# Patient Record
Sex: Female | Born: 1985 | Race: Black or African American | Hispanic: No | Marital: Single | Smoking: Never smoker
Health system: Southern US, Community
[De-identification: ages and names within clinical notes are randomized; demographics above are authoritative.]

## PROBLEM LIST (undated history)

## (undated) DIAGNOSIS — T7840XA Allergy, unspecified, initial encounter: Secondary | ICD-10-CM

## (undated) HISTORY — DX: Allergy, unspecified, initial encounter: T78.40XA

## (undated) HISTORY — PX: NO PAST SURGERIES: SHX2092

---
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
## 2015-10-11 ENCOUNTER — Ambulatory Visit (INDEPENDENT_AMBULATORY_CARE_PROVIDER_SITE_OTHER): Payer: Self-pay | Admitting: Family Medicine

## 2015-10-11 VITALS — BP 114/70 | HR 71 | Temp 98.5°F | Resp 16 | Ht 66.0 in | Wt 123.0 lb

## 2015-10-11 DIAGNOSIS — L858 Other specified epidermal thickening: Secondary | ICD-10-CM

## 2015-10-11 DIAGNOSIS — Q829 Congenital malformation of skin, unspecified: Secondary | ICD-10-CM

## 2015-10-11 DIAGNOSIS — R1013 Epigastric pain: Secondary | ICD-10-CM

## 2015-10-11 MED ORDER — TRIAMCINOLONE ACETONIDE 0.1 % EX CREA: 1.0000 "application " | TOPICAL_CREAM | Freq: Two times a day (BID) | CUTANEOUS | Status: DC

## 2015-10-11 MED ORDER — RANITIDINE HCL 150 MG PO TABS: 150.0000 mg | ORAL_TABLET | Freq: Two times a day (BID) | ORAL | Status: DC

## 2015-10-11 NOTE — Progress Notes (Signed)
   Subjective:    Patient ID: Sandra Nicholson, female    DOB: 02/10/1986, 30 y.o.   MRN: [id number] By signing my name below, I, Sandra Nicholson, attest that this documentation has been prepared under the direction and in the presence of Elvina SidleKurt Tarin Johndrow, MD.  Electronically Signed: Littie Deedsichard Nicholson, Medical Scribe. 10/11/2015. 10:33 AM.  HPI HPI Comments: Sandra Nicholson is a 30 y.o. female with a history of exercise-induced asthma who presents to the [facility] complaining of an episode of sharp abdominal pain last night. She does not currently have any abdominal pain. Patient has also felt muscle spasms/vesiculations in her legs. She also reports having swelling to her eyes, but she does not think this is due to seasonal allergies. She reports having a rash to her left hand, right arm, and upper chest. She often feels gassy which is sometimes improved after taking a laxative. Patient denies diarrhea, fever, chills, and diaphoresis. She states she had an episode of abdominal pain after eating a few weeks ago, which was resolved after taking a medication.  Patient is currently unemployed.  Review of Systems  Constitutional: Negative for fever, chills and diaphoresis.  Gastrointestinal: Positive for abdominal pain. Negative for diarrhea.  Musculoskeletal: Positive for myalgias.  Skin: Positive for rash.  Allergic/Immunologic: Positive for environmental allergies.       Objective:   Physical Exam CONSTITUTIONAL: Well developed/well nourished HEAD: Normocephalic/atraumatic EYES: EOM/PERRL. Mild hyperpigmentation of her lower lids with mild edema. No scleral injection.  ENMT: Mucous membranes moist. Ears normal. Ears normal. NECK: supple no meningeal signs SPINE: entire spine nontender CV: Regular rate and rhythm. S1/S2 noted, no murmurs/rubs/gallops noted LUNGS: Lungs are clear to auscultation bilaterally, no apparent distress ABDOMEN: soft, nontender, no rebound or guarding GU:  no cva tenderness NEURO: Pt is awake/alert, moves all extremitiesx4 EXTREMITIES: pulses normal, full ROM SKIN: warm, color normal PSYCH: no abnormalities of mood noted        Assessment & Plan:   This chart was scribed in my presence and reviewed by me personally.    ICD-9-CM ICD-10-CM   1. Abdominal pain, epigastric 789.06 R10.13 ranitidine (ZANTAC) 150 MG tablet  2. Keratosis pilaris 757.39 Q82.9 triamcinolone cream (KENALOG) 0.1 %     Signed, Elvina SidleKurt Yasser Hepp, MD

## 2015-10-11 NOTE — Patient Instructions (Addendum)
Please start Align (probiotic) twice a day for one week.  Stop Reglan.

## 2016-08-09 ENCOUNTER — Ambulatory Visit (HOSPITAL_COMMUNITY)
Admission: EM | Admit: 2016-08-09 | Discharge: 2016-08-09 | Disposition: A | Discharge status: Home / Self Care | Payer: Self-pay | Attending: Family Medicine | Admitting: Family Medicine

## 2016-08-09 ENCOUNTER — Encounter (HOSPITAL_COMMUNITY): Payer: Self-pay | Admitting: Family Medicine

## 2016-08-09 DIAGNOSIS — J028 Acute pharyngitis due to other specified organisms: Secondary | ICD-10-CM

## 2016-08-09 DIAGNOSIS — J029 Acute pharyngitis, unspecified: Secondary | ICD-10-CM | POA: Insufficient documentation

## 2016-08-09 DIAGNOSIS — Z87891 Personal history of nicotine dependence: Secondary | ICD-10-CM | POA: Insufficient documentation

## 2016-08-09 DIAGNOSIS — Z833 Family history of diabetes mellitus: Secondary | ICD-10-CM | POA: Insufficient documentation

## 2016-08-09 LAB — POCT RAPID STREP A: STREPTOCOCCUS, GROUP A SCREEN (DIRECT): NEGATIVE

## 2016-08-09 MED ORDER — LIDOCAINE VISCOUS 2 % MT SOLN: 20.0000 mL | OROMUCOSAL | 0 refills | Status: DC | PRN

## 2016-08-09 MED ORDER — AMOXICILLIN 875 MG PO TABS: 875.0000 mg | ORAL_TABLET | Freq: Two times a day (BID) | ORAL | 0 refills | Status: DC

## 2016-08-09 NOTE — ED Triage Notes (Signed)
Pt here for sore throat and tonsillar swelling.

## 2016-08-09 NOTE — ED Provider Notes (Signed)
CSN: [id number]     Arrival date & time 08/09/16  1433 History   First MD Initiated Contact with Patient 08/09/16 1633     Chief Complaint  Patient presents with  . Sore Throat   (Consider location/radiation/quality/duration/timing/severity/associated sxs/prior Treatment) Patient c/o sore throat for 2 days   The history is provided by the patient.  Sore Throat  This is a new problem. The problem occurs constantly. The problem has not changed since onset.Nothing aggravates the symptoms. Nothing relieves the symptoms. She has tried nothing for the symptoms.    Past Medical History:  Diagnosis Date  . Allergy    History reviewed. No pertinent surgical history. Family History  Problem Relation Age of Onset  . Diabetes Father    Social History  Substance Use Topics  . Smoking status: Former Games developermoker  . Smokeless tobacco: Never Used  . Alcohol use Not on file   OB History    No data available     Review of Systems  Constitutional: Positive for fatigue.  HENT: Positive for sore throat.   Respiratory: Negative.   Cardiovascular: Negative.   Gastrointestinal: Negative.   Endocrine: Negative.   Genitourinary: Negative.   Musculoskeletal: Negative.   Allergic/Immunologic: Negative.   Neurological: Negative.   Hematological: Negative.     Allergies  Patient has no known allergies.  Home Medications   Prior to Admission medications   Medication Sig Start Date End Date Taking? Authorizing Provider  amoxicillin (AMOXIL) 875 MG tablet Take 1 tablet (875 mg total) by mouth 2 (two) times daily. 08/09/16   Deatra CanterWilliam J Harly Pipkins, FNP  lidocaine (XYLOCAINE) 2 % solution Use as directed 20 mLs in the mouth or throat as needed for mouth pain. 08/09/16   Deatra CanterWilliam J Beth Spackman, FNP  ranitidine (ZANTAC) 150 MG tablet Take 1 tablet (150 mg total) by mouth 2 (two) times daily. 10/11/15   Elvina SidleKurt Lauenstein, MD  triamcinolone cream (KENALOG) 0.1 % Apply 1 application topically 2 (two) times daily.  10/11/15   Elvina SidleKurt Lauenstein, MD   Meds Ordered and Administered this Visit  Medications - No data to display  BP 116/83   Pulse 64   Temp 98.2 F (36.8 C)   Resp 18   SpO2 100%  No data found.   Physical Exam  Constitutional: She appears well-developed and well-nourished.  HENT:  Head: Normocephalic.  Right Ear: External ear normal.  Left Ear: External ear normal.  opx - erythematous and with swelling and petechia.  No cobblestone    Eyes: Conjunctivae and EOM are normal. Pupils are equal, round, and reactive to light.  Cardiovascular: Normal rate, regular rhythm and normal heart sounds.   Pulmonary/Chest: Effort normal and breath sounds normal.  Abdominal: Soft. Bowel sounds are normal.  Lymphadenopathy:    She has cervical adenopathy.  Nursing note and vitals reviewed.   Urgent Care Course     Procedures (including critical care time)  Labs Review Labs Reviewed  POCT RAPID STREP A    Imaging Review No results found.   Visual Acuity Review  Right Eye Distance:   Left Eye Distance:   Bilateral Distance:    Right Eye Near:   Left Eye Near:    Bilateral Near:         MDM   1. Acute pharyngitis due to other specified organisms    Amoxicillin 875mg  one po bid x 7 days #14 Lidocaine Viscous 20ml po qid prn #14000ml  Push po fluids, rest, tylenol and motrin otc  prn as directed for fever, arthralgias, and myalgias.  Follow up prn if sx's continue or persist.    Deatra Canter, FNP 08/09/16 1651

## 2016-08-12 LAB — CULTURE, GROUP A STREP (THRC)

## 2016-12-01 ENCOUNTER — Ambulatory Visit (HOSPITAL_COMMUNITY)
Admission: EM | Admit: 2016-12-01 | Discharge: 2016-12-01 | Disposition: A | Discharge status: Home / Self Care | Payer: Self-pay | Attending: Family Medicine | Admitting: Family Medicine

## 2016-12-01 ENCOUNTER — Encounter (HOSPITAL_COMMUNITY): Payer: Self-pay | Admitting: *Deleted

## 2016-12-01 DIAGNOSIS — R110 Nausea: Secondary | ICD-10-CM

## 2016-12-01 DIAGNOSIS — T783XXA Angioneurotic edema, initial encounter: Secondary | ICD-10-CM

## 2016-12-01 DIAGNOSIS — R109 Unspecified abdominal pain: Secondary | ICD-10-CM

## 2016-12-01 MED ORDER — ONDANSETRON 4 MG PO TBDP: 4.0000 mg | ORAL_TABLET | Freq: Three times a day (TID) | ORAL | 0 refills | Status: DC | PRN

## 2016-12-01 MED ORDER — CHLORHEXIDINE GLUCONATE 0.12 % MT SOLN: 15.0000 mL | Freq: Two times a day (BID) | OROMUCOSAL | 0 refills | Status: DC

## 2016-12-01 MED ORDER — METHYLPREDNISOLONE 4 MG PO TBPK: ORAL_TABLET | ORAL | 0 refills | Status: DC

## 2016-12-01 NOTE — ED Triage Notes (Signed)
Patient reports 2 days ago she was eating and afterwards felt sick to her stomach then felt like she was having an allergic reaction to her lips and mouth. Patient states she still feels a "rumble" in her stomach with lack of appetite. Patient reports small bumps inside of mouth.

## 2016-12-01 NOTE — ED Provider Notes (Signed)
CSN: [id number]     Arrival date & time 12/01/16  1442 History   None    Chief Complaint  Patient presents with  . Abdominal Pain  . Allergic Reaction   (Consider location/radiation/quality/duration/timing/severity/associated sxs/prior Treatment) Patient c/o having allergic reaction to something she ate and her lips have been swelling.  She felt sick to her stomach and has a rumble in her stomach.  She denies fever and    The history is provided by the patient.  Abdominal Pain  Pain location:  Generalized Pain radiates to:  Does not radiate Pain severity:  Mild Onset quality:  Sudden Duration:  1 week Timing:  Constant Chronicity:  New Allergic Reaction    Past Medical History:  Diagnosis Date  . Allergy    History reviewed. No pertinent surgical history. Family History  Problem Relation Age of Onset  . Diabetes Father    Social History  Substance Use Topics  . Smoking status: Former Games developermoker  . Smokeless tobacco: Never Used  . Alcohol use 0.0 oz/week     Comment: occ   OB History    No data available     Review of Systems  Constitutional: Negative.   HENT: Negative.   Eyes: Negative.   Respiratory: Negative.   Cardiovascular: Negative.   Gastrointestinal: Positive for abdominal pain.  Endocrine: Negative.   Genitourinary: Negative.   Allergic/Immunologic: Negative.   Neurological: Negative.     Allergies  Macadamia nut oil  Home Medications   Prior to Admission medications   Medication Sig Start Date End Date Taking? Authorizing Provider  amoxicillin (AMOXIL) 875 MG tablet Take 1 tablet (875 mg total) by mouth 2 (two) times daily. 08/09/16   Deatra Canterxford, Andrey Hoobler J, FNP  chlorhexidine (PERIDEX) 0.12 % solution Use as directed 15 mLs in the mouth or throat 2 (two) times daily. 12/01/16   Deatra Canterxford, Lateesha Bezold J, FNP  lidocaine (XYLOCAINE) 2 % solution Use as directed 20 mLs in the mouth or throat as needed for mouth pain. 08/09/16   Deatra Canterxford, [place] Schweppe J, FNP   methylPREDNISolone (MEDROL DOSEPAK) 4 MG TBPK tablet Take 6-5-4-3-2-1 po qd 12/01/16   Deatra Canterxford, Jamylah Marinaccio J, FNP  methylPREDNISolone (MEDROL DOSEPAK) 4 MG TBPK tablet Take 6-5-4-3-2-1 po qd 12/01/16   Deatra Canterxford, Alexza Norbeck J, FNP  ondansetron (ZOFRAN ODT) 4 MG disintegrating tablet Take 1 tablet (4 mg total) by mouth every 8 (eight) hours as needed for nausea or vomiting. 12/01/16   Deatra Canterxford, Coty Larsh J, FNP  ondansetron (ZOFRAN ODT) 4 MG disintegrating tablet Take 1 tablet (4 mg total) by mouth every 8 (eight) hours as needed for nausea or vomiting. 12/01/16   Deatra Canterxford, Endy Easterly J, FNP  ranitidine (ZANTAC) 150 MG tablet Take 1 tablet (150 mg total) by mouth 2 (two) times daily. 10/11/15   Elvina SidleLauenstein, Kurt, MD  triamcinolone cream (KENALOG) 0.1 % Apply 1 application topically 2 (two) times daily. 10/11/15   Elvina SidleLauenstein, Kurt, MD   Meds Ordered and Administered this Visit  Medications - No data to display  BP 120/80   Pulse 87   Temp 98 F (36.7 C) (Oral)   Resp 17   SpO2 100%  No data found.   Physical Exam  Constitutional: She is oriented to person, place, and time. She appears well-developed and well-nourished.  HENT:  Head: Normocephalic and atraumatic.  Eyes: Conjunctivae and EOM are normal. Pupils are equal, round, and reactive to light.  Neck: Normal range of motion. Neck supple.  Cardiovascular: Normal rate, regular rhythm and normal  heart sounds.   Pulmonary/Chest: Effort normal and breath sounds normal.  Abdominal: Soft. Bowel sounds are normal.  Musculoskeletal: Normal range of motion.  Neurological: She is alert and oriented to person, place, and time.  Nursing note and vitals reviewed.   Urgent Care Course     Procedures (including critical care time)  Labs Review Labs Reviewed - No data to display  Imaging Review No results found.   Visual Acuity Review  Right Eye Distance:   Left Eye Distance:   Bilateral Distance:    Right Eye Near:   Left Eye Near:    Bilateral  Near:         MDM   1. Angioedema, initial encounter   2. Nausea    Medrol dose pack as directed zofran peridex oral solution       Deatra Canter, FNP 12/01/16 1712

## 2017-01-11 ENCOUNTER — Encounter (HOSPITAL_COMMUNITY): Payer: Self-pay | Admitting: Emergency Medicine

## 2017-01-11 ENCOUNTER — Ambulatory Visit (HOSPITAL_COMMUNITY)
Admission: EM | Admit: 2017-01-11 | Discharge: 2017-01-11 | Disposition: A | Discharge status: Home / Self Care | Payer: Self-pay | Attending: Family Medicine | Admitting: Family Medicine

## 2017-01-11 DIAGNOSIS — R110 Nausea: Secondary | ICD-10-CM | POA: Insufficient documentation

## 2017-01-11 DIAGNOSIS — J029 Acute pharyngitis, unspecified: Secondary | ICD-10-CM | POA: Insufficient documentation

## 2017-01-11 DIAGNOSIS — R109 Unspecified abdominal pain: Secondary | ICD-10-CM | POA: Insufficient documentation

## 2017-01-11 DIAGNOSIS — M62838 Other muscle spasm: Secondary | ICD-10-CM

## 2017-01-11 DIAGNOSIS — Z87891 Personal history of nicotine dependence: Secondary | ICD-10-CM | POA: Insufficient documentation

## 2017-01-11 DIAGNOSIS — M62831 Muscle spasm of calf: Secondary | ICD-10-CM | POA: Insufficient documentation

## 2017-01-11 LAB — POCT RAPID STREP A: Streptococcus, Group A Screen (Direct): NEGATIVE

## 2017-01-11 MED ORDER — DICLOFENAC SODIUM 50 MG PO TBEC: 50.0000 mg | DELAYED_RELEASE_TABLET | Freq: Two times a day (BID) | ORAL | 0 refills | Status: DC

## 2017-01-11 MED ORDER — ONDANSETRON 4 MG PO TBDP
4.0000 mg | ORAL_TABLET | Freq: Three times a day (TID) | ORAL | 0 refills | Status: DC | PRN
Start: 2017-01-11 — End: 2017-03-03

## 2017-01-11 MED ORDER — CYCLOBENZAPRINE HCL 5 MG PO TABS: 5.0000 mg | ORAL_TABLET | Freq: Two times a day (BID) | ORAL | 0 refills | Status: DC | PRN

## 2017-01-11 NOTE — ED Provider Notes (Signed)
[place]    CSN: [id number] Arrival date & time: 01/11/17  1259  History   Chief Complaint Chief Complaint  Patient presents with  . Sore Throat  . Abdominal Pain    HPI Sandra Nicholson is a 31 y.o. female.   HPI Sore throat  The patient has been complaining of a sore throat over the past 4 days. It is slightly improved. She's not having upper respiratory signs or symptoms. She denies any fevers. No sick contacts. She's not tried anything at home. She was rx'd viscous lidocaine in the past which she said was helpful and she is requesting more today.  Abdominal complaint The patient has a strange sensation in her abdomen she has difficulty quantifying and describing. She is having some nausea without vomiting. Her bowel movements are unchanged. There was no physical injury to her stomach. No new foods, recent travel, medication changes, bleeding, recent antibiotic use, or sick contacts for this.  Spasms The patient has a history of spasms in her lower extremities. Over the past week, it has been bothering her. She states it is worsening. She would like a muscle relaxant. She maintains that she does have a good oral intake of fluids. There is no pain. Her legs move on their own. She does not have any numbness, tearing, or weakness. No history of seizures.  Past Medical History:  Diagnosis Date  . Allergy     History reviewed. No pertinent surgical history.   Home Medications    Prior to Admission medications   Medication Sig Start Date End Date Taking? Authorizing Provider  cyclobenzaprine (FLEXERIL) 5 MG tablet Take 1 tablet (5 mg total) by mouth 2 (two) times daily as needed for muscle spasms. 01/11/17   Shelda Pal, DO  diclofenac (VOLTAREN) 50 MG EC tablet Take 1 tablet (50 mg total) by mouth 2 (two) times daily. 01/11/17   Shelda Pal, DO  ondansetron (ZOFRAN ODT) 4 MG disintegrating tablet Take 1 tablet (4 mg total) by mouth every 8 (eight)  hours as needed for nausea or vomiting. 01/11/17   Shelda Pal, DO    Family History Family History  Problem Relation Age of Onset  . Diabetes Father     Social History Social History  Substance Use Topics  . Smoking status: Former Research scientist (life sciences)  . Smokeless tobacco: Never Used  . Alcohol use 0.0 oz/week     Comment: occ    Allergies   Macadamia nut oil   Review of Systems Review of Systems  Constitutional: Negative for fever.  Gastrointestinal: Positive for nausea.     Physical Exam Triage Vital Signs ED Triage Vitals  Enc Vitals Group     BP 01/11/17 1353 109/67     Pulse Rate 01/11/17 1353 68     Resp 01/11/17 1353 16     Temp 01/11/17 1353 98.7 F (37.1 C)     Temp Source 01/11/17 1353 Oral     SpO2 01/11/17 1353 100 %     Weight 01/11/17 1350 143 lb (64.9 kg)     Height 01/11/17 1350 '5\' 7"'$  (1.702 m)   Updated Vital Signs BP 109/67   Pulse 68   Temp 98.7 F (37.1 C) (Oral)   Resp 16   Ht '5\' 7"'$  (1.702 m)   Wt 143 lb (64.9 kg)   LMP 12/25/2016   SpO2 100%   BMI 22.40 kg/m   Physical Exam  Constitutional: She is oriented to person, place, and  time. She appears well-developed and well-nourished.  HENT:  Head: Normocephalic and atraumatic.  Right Ear: External ear normal.  Left Ear: External ear normal.  Nose: Nose normal.  Mouth/Throat: Oropharynx is clear and moist. No oropharyngeal exudate.  Eyes: EOM are normal. Pupils are equal, round, and reactive to light.  Neck: Normal range of motion. Neck supple.  Cardiovascular: Normal rate and regular rhythm.   No murmur heard. Pulmonary/Chest: Effort normal and breath sounds normal. No respiratory distress.  Abdominal: Soft. Bowel sounds are normal. She exhibits no distension and no mass. There is no tenderness. There is no guarding.  Musculoskeletal: Normal range of motion. She exhibits no tenderness.  Lymphadenopathy:    She has no cervical adenopathy.  Neurological: She is alert and oriented  to person, place, and time. She displays normal reflexes.  Skin: Skin is warm and dry. She is not diaphoretic.     UC Treatments / Results  Labs (all labs ordered are listed, but only abnormal results are displayed) Labs Reviewed  POCT RAPID STREP A   Procedures Procedures none  Initial Impression / Assessment and Plan / UC Course  I have reviewed the triage vital signs and the nursing notes.  Pertinent labs & imaging results that were available during my care of the patient were reviewed by me and considered in my medical decision making (see chart for details).     31 year old patient presents with a myriad of complaints. She does not have a set primary care physician. She presents with one week of muscle spasms that are worsening. Recommended adequate hydration, trial of OTC magnesium, and muscle relaxant as needed. Emphasized that she needs to see a primary care physician for further management of this issue. Rapid strep is negative. 1/4 Centor criteria (lack of cough). Anti-inflammatory for this. She also brought up dry mouth which I recommended a saliva stimulating mouthwash. She also had very nonspecific and vague abdominal complaints. She does have some nausea without vomiting. There are no red flag signs or suggestion of an acute abdomen on exam. Will treat symptomatically with Zofran. Again, I emphasized on multiple occasions that she needs to schedule an appointment with a primary care physician to manage her health issues with better continuity. She is discharged in stable condition. She voiced understanding and agreement to the plan.  Final Clinical Impressions(s) / UC Diagnoses   Final diagnoses:  Sore throat  Nausea  Muscle spasm of both lower legs    New Prescriptions New Prescriptions   CYCLOBENZAPRINE (FLEXERIL) 5 MG TABLET    Take 1 tablet (5 mg total) by mouth 2 (two) times daily as needed for muscle spasms.   DICLOFENAC (VOLTAREN) 50 MG EC TABLET    Take 1  tablet (50 mg total) by mouth 2 (two) times daily.   ONDANSETRON (ZOFRAN ODT) 4 MG DISINTEGRATING TABLET    Take 1 tablet (4 mg total) by mouth every 8 (eight) hours as needed for nausea or vomiting.     Shelda Pal, [place] 01/11/17 1453

## 2017-01-11 NOTE — Discharge Instructions (Signed)
Get a primary care provider to help address some of these issues and provide better continuity.  Biotene mouthwash can help with dry mouth.

## 2017-01-11 NOTE — ED Triage Notes (Signed)
PT reports sore throat for 4 days, epigastric pain today, and muscle spasms in legs for 1 week.

## 2017-01-14 LAB — CULTURE, GROUP A STREP (THRC)

## 2017-03-02 ENCOUNTER — Encounter (HOSPITAL_COMMUNITY): Payer: Self-pay | Admitting: Emergency Medicine

## 2017-03-02 DIAGNOSIS — R1013 Epigastric pain: Secondary | ICD-10-CM | POA: Insufficient documentation

## 2017-03-02 DIAGNOSIS — M62838 Other muscle spasm: Secondary | ICD-10-CM | POA: Insufficient documentation

## 2017-03-02 DIAGNOSIS — G43909 Migraine, unspecified, not intractable, without status migrainosus: Secondary | ICD-10-CM | POA: Insufficient documentation

## 2017-03-02 DIAGNOSIS — R110 Nausea: Secondary | ICD-10-CM | POA: Insufficient documentation

## 2017-03-02 LAB — COMPREHENSIVE METABOLIC PANEL
ALBUMIN: 3.8 g/dL (ref 3.5–5.0)
ALK PHOS: 47 U/L (ref 38–126)
ALT: 15 U/L (ref 14–54)
AST: 23 U/L (ref 15–41)
Anion gap: 7 (ref 5–15)
BILIRUBIN TOTAL: 0.2 mg/dL — AB (ref 0.3–1.2)
BUN: 5 mg/dL — ABNORMAL LOW (ref 6–20)
CALCIUM: 9.2 mg/dL (ref 8.9–10.3)
CO2: 26 mmol/L (ref 22–32)
Chloride: 105 mmol/L (ref 101–111)
Creatinine, Ser: 1.04 mg/dL — ABNORMAL HIGH (ref 0.44–1.00)
GFR calc non Af Amer: >60 mL/min (ref >60)
Glucose, Bld: 100 mg/dL — ABNORMAL HIGH (ref 65–99)
Potassium: 3.6 mmol/L (ref 3.5–5.1)
SODIUM: 138 mmol/L (ref 135–145)
TOTAL PROTEIN: 7.1 g/dL (ref 6.5–8.1)

## 2017-03-02 LAB — CBC
HCT: 36.4 % (ref 36.0–46.0)
Hemoglobin: 11.7 g/dL — ABNORMAL LOW (ref 12.0–15.0)
MCH: 26.1 pg (ref 26.0–34.0)
MCHC: 32.1 g/dL (ref 30.0–36.0)
MCV: 81.1 fL (ref 78.0–100.0)
PLATELETS: 356 10*3/uL (ref 150–400)
RBC: 4.49 MIL/uL (ref 3.87–5.11)
RDW: 15.2 % (ref 11.5–15.5)
WBC: 5.6 10*3/uL (ref 4.0–10.5)

## 2017-03-02 LAB — LIPASE, BLOOD: Lipase: 28 U/L (ref 11–51)

## 2017-03-02 LAB — URINALYSIS, ROUTINE W REFLEX MICROSCOPIC
Bilirubin Urine: NEGATIVE
GLUCOSE, UA: NEGATIVE mg/dL
Hgb urine dipstick: NEGATIVE
Ketones, ur: NEGATIVE mg/dL
Nitrite: NEGATIVE
PROTEIN: NEGATIVE mg/dL
SPECIFIC GRAVITY, URINE: 1.024 (ref 1.005–1.030)
pH: 5 (ref 5.0–8.0)

## 2017-03-02 MED ORDER — ONDANSETRON 4 MG PO TBDP
ORAL_TABLET | ORAL | Status: AC
  Filled 2017-03-02: qty 1

## 2017-03-02 MED ORDER — ONDANSETRON 4 MG PO TBDP
4.0000 mg | ORAL_TABLET | Freq: Once | ORAL | Status: AC | PRN
  Administered 2017-03-02: 4 mg via ORAL

## 2017-03-02 NOTE — ED Triage Notes (Signed)
Pt presents with migraine with upper abd pain and nausea and muscle cramping; pt states she suspects food poisoning; pt also states she took excedrine and goody powder which helped for a little while but returned Pt also requesting to file police report about trespassers at her house and strong vibrations that she feels from the roof through the house to the floor; pt suspects this is related to her current illness; pt denies abuse or assault

## 2017-03-03 ENCOUNTER — Emergency Department (HOSPITAL_COMMUNITY)
Admission: EM | Admit: 2017-03-03 | Discharge: 2017-03-03 | Disposition: A | Discharge status: Home / Self Care | Payer: Self-pay | Attending: Emergency Medicine | Admitting: Emergency Medicine

## 2017-03-03 DIAGNOSIS — R1013 Epigastric pain: Secondary | ICD-10-CM

## 2017-03-03 MED ORDER — ONDANSETRON 4 MG PO TBDP: 4.0000 mg | ORAL_TABLET | Freq: Three times a day (TID) | ORAL | 0 refills | Status: DC | PRN

## 2017-03-03 MED ORDER — FAMOTIDINE 20 MG PO TABS: 20.0000 mg | ORAL_TABLET | Freq: Two times a day (BID) | ORAL | 0 refills | Status: DC

## 2017-03-03 MED ORDER — GI COCKTAIL ~~LOC~~
30.0000 mL | Freq: Once | ORAL | Status: AC
  Administered 2017-03-03: 30 mL via ORAL
  Filled 2017-03-03: qty 30

## 2017-03-03 MED ORDER — PANTOPRAZOLE SODIUM 40 MG PO TBEC
40.0000 mg | DELAYED_RELEASE_TABLET | Freq: Once | ORAL | Status: AC
  Administered 2017-03-03: 40 mg via ORAL
  Filled 2017-03-03: qty 1

## 2017-03-03 NOTE — ED Notes (Signed)
Pt verbalized understanding of d/c instructions and has no further questions. Pt is stable, A&Ox4, VSS.  

## 2017-03-03 NOTE — Discharge Instructions (Signed)
It was my pleasure taking care of you today!   Increase hydration. Pepcid twice daily. Zofran as needed for nausea.   Follow up with your primary care provider. If you do not have one, please see information below.  Return to emergency department for new or worsening symptoms, any additional concerns.  To find a primary care or specialty doctor please call [phone] or [phone] to access "[facility] Find a Doctor Service."  You may also go on the [facility] website at [email or web address]  There are also multiple [facility],  and [facility] practices throughout the [place] that are frequently accepting new patients. You may find a clinic that is close to your home and contact them.  [facility] - [address] [place] [phone]  [facility] in [place] (also locations in [place] and [place]) - [address] [employer] [place] [phone]  [facility] - [address] [place] [phone]

## 2017-03-03 NOTE — ED Provider Notes (Signed)
[facility] Provider Note   CSN: [id number] Arrival date & time: 03/02/17  1925     History   Chief Complaint Chief Complaint  Patient presents with  . Abdominal Pain  . Nausea  . Migraine  . Spasms    HPI Sandra Nicholson is a 31 y.o. female.  The history is provided by the patient and medical records. No language interpreter was used.  Abdominal Pain   Associated symptoms include nausea and headaches. Pertinent negatives include diarrhea, vomiting and constipation.  Migraine  Associated symptoms include abdominal pain and headaches.   Sandra Nicholson is an otherwise healthy 31 y.o. female who presents to the Emergency Department complaining of epigastric abdominal pain associated with nausea which began tonight after eating. She notes associated "metal taste" in her mouth today as well. No vomiting, diarrhea, constipation, blood in the stool, urinary complaints, fever or chills. Patient also endorses headache. She states that she often has headaches which are relieved with Excedrin. She took this several hours ago which relieved pain, but headache returned. She notes that her headache has actually improved while in ED and not bothering her very much at present. No photophobia or visual changes.   Past Medical History:  Diagnosis Date  . Allergy     There are no active problems to display for this patient.   History reviewed. No pertinent surgical history.  OB History    No data available       Home Medications    Prior to Admission medications   Medication Sig Start Date End Date Taking? Authorizing Provider  cyclobenzaprine (FLEXERIL) 5 MG tablet Take 1 tablet (5 mg total) by mouth 2 (two) times daily as needed for muscle spasms. 01/11/17   Sharlene Dory, DO  diclofenac (VOLTAREN) 50 MG EC tablet Take 1 tablet (50 mg total) by mouth 2 (two) times daily. 01/11/17   Sharlene Dory, DO  famotidine (PEPCID) 20 MG tablet Take 1 tablet (20 mg total)  by mouth 2 (two) times daily. 03/03/17   Ward, Chase Picket, PA-C  ondansetron (ZOFRAN ODT) 4 MG disintegrating tablet Take 1 tablet (4 mg total) by mouth every 8 (eight) hours as needed for nausea or vomiting. 03/03/17   Ward, Chase Picket, PA-C    Family History Family History  Problem Relation Age of Onset  . Diabetes Father     Social History Social History  Substance Use Topics  . Smoking status: Former Games developer  . Smokeless tobacco: Never Used  . Alcohol use 0.0 oz/week     Comment: occ     Allergies   Fish allergy and Macadamia nut oil   Review of Systems Review of Systems  Gastrointestinal: Positive for abdominal pain and nausea. Negative for blood in stool, constipation, diarrhea and vomiting.  Neurological: Positive for headaches. Negative for dizziness, syncope, weakness and numbness.  All other systems reviewed and are negative.    Physical Exam Updated Vital Signs BP 100/77 (BP Location: Right Arm)   Pulse (!) 58   Temp 98.2 F (36.8 C) (Oral)   Resp 18   Ht 5\' 7"  (1.702 m)   Wt 63.5 kg (140 lb)   LMP 02/23/2017   SpO2 100%   BMI 21.93 kg/m   Physical Exam  Constitutional: She is oriented to person, place, and time. She appears well-developed and well-nourished. No distress.  HENT:  Head: Normocephalic and atraumatic.  Cardiovascular: Normal rate, regular rhythm and normal heart sounds.   No murmur  heard. Pulmonary/Chest: Effort normal and breath sounds normal. No respiratory distress.  Abdominal: Soft. She exhibits no distension.  Epigastric tenderness without rebound or guarding. Negative Murphy's.  Musculoskeletal: She exhibits no edema.  Neurological: She is alert and oriented to person, place, and time.  Speech clear and goal oriented. CN 2-12 grossly intact. Normal finger-to-nose and rapid alternating movements. No drift. Strength and sensation intact. Steady gait.  Skin: Skin is warm and dry.  Nursing note and vitals reviewed.    ED  Treatments / Results  Labs (all labs ordered are listed, but only abnormal results are displayed) Labs Reviewed  COMPREHENSIVE METABOLIC PANEL - Abnormal; Notable for the following:       Result Value   Glucose, Bld 100 (*)    BUN 5 (*)    Creatinine, Ser 1.04 (*)    Total Bilirubin 0.2 (*)    All other components within normal limits  CBC - Abnormal; Notable for the following:    Hemoglobin 11.7 (*)    All other components within normal limits  URINALYSIS, ROUTINE W REFLEX MICROSCOPIC - Abnormal; Notable for the following:    APPearance HAZY (*)    Leukocytes, UA TRACE (*)    Bacteria, UA RARE (*)    Squamous Epithelial / LPF 6-30 (*)    All other components within normal limits  LIPASE, BLOOD    EKG  EKG Interpretation None       Radiology No results found.  Procedures Procedures (including critical care time)  Medications Ordered in ED Medications  ondansetron (ZOFRAN-ODT) disintegrating tablet 4 mg (4 mg Oral Given 03/02/17 1959)  gi cocktail (Maalox,Lidocaine,Donnatal) (30 mLs Oral Given 03/03/17 0144)  pantoprazole (PROTONIX) EC tablet 40 mg (40 mg Oral Given 03/03/17 0143)     Initial Impression / Assessment and Plan / ED Course  I have reviewed the triage vital signs and the nursing notes.  Pertinent labs & imaging results that were available during my care of the patient were reviewed by me and considered in my medical decision making (see chart for details).    Sandra Nicholson is a 31 y.o. female who presents to ED for epigastric abdominal pain associated with nausea and metallic taste in the mouth. Improved with symptomatic management of pepcid, gi cocktail and zofran. Labs reviewed and reassuring. On repeat exam, abdominal exam with no peritoneal signs. Also endorsing headache which improved with Excedrin typical of her usual headaches. No focal neuro deficits. She notes her headache has actually improved while in ED and does not feel like she needs further  pain control for this. Evaluation does not show pathology that would require ongoing emergent intervention or inpatient treatment. Patient discharged home with symptomatic treatment and encouraged to follow up with PCP. I have also discussed reasons to return immediately to the ER. Patient expresses understanding and agrees with plan as dictated above.   Final Clinical Impressions(s) / ED Diagnoses   Final diagnoses:  Epigastric pain    New Prescriptions Discharge Medication List as of 03/03/2017  2:25 AM    START taking these medications   Details  famotidine (PEPCID) 20 MG tablet Take 1 tablet (20 mg total) by mouth 2 (two) times daily., Starting Tue 03/03/2017, Print         Ward, Chase Picket, PA-C 03/03/17 0528    Ward, Layla Maw, DO 03/03/17 0530

## 2019-02-17 ENCOUNTER — Encounter (HOSPITAL_COMMUNITY): Payer: Self-pay

## 2019-02-17 ENCOUNTER — Ambulatory Visit (HOSPITAL_COMMUNITY)
Admission: EM | Admit: 2019-02-17 | Discharge: 2019-02-17 | Disposition: A | Discharge status: Home / Self Care | Payer: Self-pay | Attending: Emergency Medicine | Admitting: Emergency Medicine

## 2019-02-17 ENCOUNTER — Other Ambulatory Visit: Payer: Self-pay

## 2019-02-17 DIAGNOSIS — R110 Nausea: Secondary | ICD-10-CM

## 2019-02-17 DIAGNOSIS — R0981 Nasal congestion: Secondary | ICD-10-CM

## 2019-02-17 MED ORDER — SALINE SPRAY 0.65 % NA SOLN: 2.0000 | NASAL | 0 refills | Status: DC | PRN

## 2019-02-17 MED ORDER — ONDANSETRON 4 MG PO TBDP: ORAL_TABLET | ORAL | 0 refills | Status: DC

## 2019-02-17 NOTE — ED Triage Notes (Signed)
Pt states she has nausea and her nose is stopped up this started yesterday. Pt states the nausea started after she ate once yesterday.  Pt states that the top of her mouth is irritated.

## 2019-02-17 NOTE — ED Provider Notes (Signed)
[facility]    CSN: [id number] Arrival date & time: 02/17/19  [id number]     History   Chief Complaint Chief Complaint  Patient presents with  . Nausea    HPI Sandra Amelrica D Peretti is a 33 y.o. female.   HPI Sandra Nicholson is a 33 y.o. female presenting to UC with c/o nausea without vomiting that started after she ate a pizza with sausage and pepperoni. She is also c/o mild mouth irritation she describes as tasting phlegm in her mouth but denies mouth or tooth pain. Denies sore throat.  Denies fever, chills, vomiting or diarrhea. Denies cough, chest pain or SOB. Denies abdominal pain. Denies known sick contacts or recent travel.   Past Medical History:  Diagnosis Date  . Allergy     There are no active problems to display for this patient.   History reviewed. No pertinent surgical history.  OB History   No obstetric history on file.      Home Medications    Prior to Admission medications   Medication Sig Start Date End Date Taking? Authorizing Provider  cyclobenzaprine (FLEXERIL) 5 MG tablet Take 1 tablet (5 mg total) by mouth 2 (two) times daily as needed for muscle spasms. 01/11/17   Sharlene DoryWendling, Nicholas Paul, DO  diclofenac (VOLTAREN) 50 MG EC tablet Take 1 tablet (50 mg total) by mouth 2 (two) times daily. 01/11/17   Sharlene DoryWendling, Nicholas Paul, DO  famotidine (PEPCID) 20 MG tablet Take 1 tablet (20 mg total) by mouth 2 (two) times daily. 03/03/17   Ward, Chase PicketJaime Pilcher, PA-C  ondansetron (ZOFRAN ODT) 4 MG disintegrating tablet 4mg  ODT q4 hours prn nausea/vomit 02/17/19   Waylan RocherPhelps, Hooper Petteway O, PA-C  sodium chloride (OCEAN) 0.65 % SOLN nasal spray Place 2 sprays into both nostrils as needed for congestion. 02/17/19   Lurene ShadowPhelps, Ammiel Guiney O, PA-C    Family History Family History  Problem Relation Age of Onset  . Diabetes Father     Social History Social History   Tobacco Use  . Smoking status: Former Games developermoker  . Smokeless tobacco: Never Used  Substance Use Topics  . Alcohol use: Yes   Alcohol/week: 0.0 standard drinks    Comment: occ  . Drug use: No     Allergies   Fish allergy and Macadamia nut oil   Review of Systems Review of Systems  Constitutional: Negative for chills and fever.  HENT: Positive for congestion. Negative for ear pain, sore throat, trouble swallowing and voice change.   Respiratory: Negative for cough and shortness of breath.   Cardiovascular: Negative for chest pain and palpitations.  Gastrointestinal: Positive for nausea. Negative for abdominal pain, diarrhea and vomiting.  Musculoskeletal: Negative for arthralgias, back pain and myalgias.  Skin: Negative for rash.  Neurological: Negative for dizziness, light-headedness and headaches.     Physical Exam Triage Vital Signs ED Triage Vitals  Enc Vitals Group     BP 02/17/19 0909 111/70     Pulse Rate 02/17/19 0909 64     Resp 02/17/19 0909 16     Temp 02/17/19 0909 98.5 F (36.9 C)     Temp Source 02/17/19 0909 Temporal     SpO2 02/17/19 0909 100 %     Weight 02/17/19 0908 140 lb (63.5 kg)     Height --      Head Circumference --      Peak Flow --      Pain Score 02/17/19 0907 0     Pain Loc --  Pain Edu? --      Excl. in GC? --    No data found.  Updated Vital Signs BP 111/70 (BP Location: Right Arm)   Pulse 64   Temp 98.5 F (36.9 C) (Temporal)   Resp 16   Wt 140 lb (63.5 kg)   LMP 02/17/2019   SpO2 100%   BMI 21.93 kg/m   Visual Acuity Right Eye Distance:   Left Eye Distance:   Bilateral Distance:    Right Eye Near:   Left Eye Near:    Bilateral Near:     Physical Exam Vitals signs and nursing note reviewed.  Constitutional:      Appearance: Normal appearance. She is well-developed.  HENT:     Head: Normocephalic and atraumatic.     Right Ear: Tympanic membrane normal.     Left Ear: Tympanic membrane normal.     Nose: Nose normal.     Right Sinus: No maxillary sinus tenderness or frontal sinus tenderness.     Left Sinus: No maxillary sinus  tenderness or frontal sinus tenderness.     Mouth/Throat:     Lips: Pink.     Mouth: Mucous membranes are moist.     Dentition: Dental caries present.     Pharynx: Oropharynx is clear. Uvula midline.  Neck:     Musculoskeletal: Normal range of motion.  Cardiovascular:     Rate and Rhythm: Normal rate and regular rhythm.  Pulmonary:     Effort: Pulmonary effort is normal.     Breath sounds: Normal breath sounds.  Musculoskeletal: Normal range of motion.  Skin:    General: Skin is warm and dry.  Neurological:     Mental Status: She is alert and oriented to person, place, and time.  Psychiatric:        Behavior: Behavior normal.      UC Treatments / Results  Labs (all labs ordered are listed, but only abnormal results are displayed) Labs Reviewed - No data to display  EKG   Radiology No results found.  Procedures Procedures (including critical care time)  Medications Ordered in UC Medications - No data to display  Initial Impression / Assessment and Plan / UC Course  I have reviewed the triage vital signs and the nursing notes.  Pertinent labs & imaging results that were available during my care of the patient were reviewed by me and considered in my medical decision making (see chart for details).     Normal exam. No evidence of underlying bacterial infection at this time. Encouraged symptomatic treatment. Will hold off on Covid testing at this time.  Encouraged f/u with PCP, resource guide provided.  Final Clinical Impressions(s) / UC Diagnoses   Final diagnoses:  Nausea without vomiting  Nasal congestion     Discharge Instructions      You may take 500mg  acetaminophen every 4-6 hours or in combination with ibuprofen 400-600mg  every 6-8 hours as needed for pain, inflammation, and fever.  Be sure to well hydrated with clear liquids and get at least 8 hours of sleep at night, preferably more while sick.   Please follow up with family medicine in 1  week if needed.      ED Prescriptions    Medication Sig Dispense Auth. Provider   ondansetron (ZOFRAN ODT) 4 MG disintegrating tablet 4mg  ODT q4 hours prn nausea/vomit 8 tablet Doroteo GlassmanPhelps, Langford Carias O, PA-C   sodium chloride (OCEAN) 0.65 % SOLN nasal spray Place 2 sprays into both nostrils as  needed for congestion. 60 mL Noe Gens, PA-C     Controlled Substance Prescriptions [place] Controlled Substance Registry consulted? Not Applicable   Noe Gens, PA-C 02/17/19 1031

## 2019-02-17 NOTE — Discharge Instructions (Signed)
  You may take 500mg acetaminophen every 4-6 hours or in combination with ibuprofen 400-600mg every 6-8 hours as needed for pain, inflammation, and fever.  Be sure to well hydrated with clear liquids and get at least 8 hours of sleep at night, preferably more while sick.   Please follow up with family medicine in 1 week if needed.   

## 2019-06-10 ENCOUNTER — Other Ambulatory Visit: Payer: Self-pay

## 2019-06-10 ENCOUNTER — Ambulatory Visit (HOSPITAL_COMMUNITY)
Admission: EM | Admit: 2019-06-10 | Discharge: 2019-06-10 | Disposition: A | Discharge status: Home / Self Care | Payer: Self-pay | Attending: Family Medicine | Admitting: Family Medicine

## 2019-06-10 ENCOUNTER — Encounter (HOSPITAL_COMMUNITY): Payer: Self-pay

## 2019-06-10 DIAGNOSIS — T7840XA Allergy, unspecified, initial encounter: Secondary | ICD-10-CM

## 2019-06-10 MED ORDER — TRIAMCINOLONE ACETONIDE 0.1 % EX CREA: 1.0000 "application " | TOPICAL_CREAM | Freq: Two times a day (BID) | CUTANEOUS | 0 refills | Status: DC

## 2019-06-10 MED ORDER — CETIRIZINE HCL 10 MG PO TABS: 10.0000 mg | ORAL_TABLET | Freq: Every day | ORAL | 0 refills | Status: DC

## 2019-06-10 NOTE — ED Provider Notes (Signed)
[place]    CSN: [id number] Arrival date & time: 06/10/19  0849      History   Chief Complaint Chief Complaint  Patient presents with  . Insect Bite    HPI Maythe Deramo Blincoe is a 33 y.o. female.   Quintella Reichert Winters presents with complaints of possible bug bites to left lower leg. Noted similar areas of redness to right lower leg approximately 2 weeks ago, these have resolved. About 2-3 days noted two areas to left lower leg. It itches. No pain. No drainage. She works in Emergency planning/management officer, she is outdoors. She wears boots, the top of the boot is below the two areas which are affected. Has applied hydrocortisone which has helped, it has somewhat improved. No fever. No known MRSA. No specific noted episode of feeling a bite/sting. Without contributing medical history.     ROS per HPI, negative if not otherwise mentioned.      Past Medical History:  Diagnosis Date  . Allergy     There are no active problems to display for this patient.   History reviewed. No pertinent surgical history.  OB History   No obstetric history on file.      Home Medications    Prior to Admission medications   Medication Sig Start Date End Date Taking? Authorizing Provider  cetirizine (ZYRTEC) 10 MG tablet Take 1 tablet (10 mg total) by mouth daily. 06/10/19   Zigmund Gottron, NP  cyclobenzaprine (FLEXERIL) 5 MG tablet Take 1 tablet (5 mg total) by mouth 2 (two) times daily as needed for muscle spasms. 01/11/17   Shelda Pal, DO  diclofenac (VOLTAREN) 50 MG EC tablet Take 1 tablet (50 mg total) by mouth 2 (two) times daily. 01/11/17   Shelda Pal, DO  famotidine (PEPCID) 20 MG tablet Take 1 tablet (20 mg total) by mouth 2 (two) times daily. 03/03/17   Ward, Ozella Almond, PA-C  ondansetron (ZOFRAN ODT) 4 MG disintegrating tablet 4mg  ODT q4 hours prn nausea/vomit 02/17/19   Leeroy Cha O, PA-C  sodium chloride (OCEAN) 0.65 % SOLN nasal spray Place 2 sprays into both  nostrils as needed for congestion. 02/17/19   Noe Gens, PA-C  triamcinolone cream (KENALOG) 0.1 % Apply 1 application topically 2 (two) times daily. 06/10/19   Zigmund Gottron, NP    Family History Family History  Problem Relation Age of Onset  . Diabetes Father     Social History Social History   Tobacco Use  . Smoking status: Former Research scientist (life sciences)  . Smokeless tobacco: Never Used  Substance Use Topics  . Alcohol use: Yes    Alcohol/week: 0.0 standard drinks    Comment: occ  . Drug use: No     Allergies   Fish allergy and Macadamia nut oil   Review of Systems Review of Systems   Physical Exam Triage Vital Signs ED Triage Vitals  Enc Vitals Group     BP 06/10/19 0913 110/69     Pulse Rate 06/10/19 0913 82     Resp 06/10/19 0913 18     Temp 06/10/19 0913 99.3 F (37.4 C)     Temp Source 06/10/19 0913 Oral     SpO2 06/10/19 0913 100 %     Weight --      Height --      Head Circumference --      Peak Flow --      Pain Score 06/10/19 0914 3  Pain Loc --      Pain Edu? --      Excl. in GC? --    No data found.  Updated Vital Signs BP 110/69 (BP Location: Left Arm)   Pulse 82   Temp 99.3 F (37.4 C) (Oral)   Resp 18   LMP 05/30/2019   SpO2 100%    Physical Exam Constitutional:      General: She is not in acute distress.    Appearance: She is well-developed.  Cardiovascular:     Rate and Rhythm: Normal rate.  Pulmonary:     Effort: Pulmonary effort is normal.  Skin:    General: Skin is warm and dry.          Comments: Two areas to left lower leg which are red, slightly firm; no fluctuance, no papule or pustule; non tender; no scaling; with some linear extension of redness from the area which is more to the anterior/medial lower leg   Neurological:     Mental Status: She is alert and oriented to person, place, and time.      UC Treatments / Results  Labs (all labs ordered are listed, but only abnormal results are displayed) Labs Reviewed -  No data to display  EKG   Radiology No results found.  Procedures Procedures (including critical care time)  Medications Ordered in UC Medications - No data to display  Initial Impression / Assessment and Plan / UC Course  I have reviewed the triage vital signs and the nursing notes.  Pertinent labs & imaging results that were available during my care of the patient were reviewed by me and considered in my medical decision making (see chart for details).    Non tender, no drainage, no fluctuance. No obvious infection such as folliculitis, cellulitis or abscess. Similar areas to right lower leg which have healed. Itching, red. Local allergic response, bug bites would be fitting. Kenalog provided. Return precautions provided. Patient verbalized understanding and agreeable to plan.   Final Clinical Impressions(s) / UC Diagnoses   Final diagnoses:  Allergic response, initial encounter     Discharge Instructions     This does not appear to be obviously an infection, likely an allergic response to either a bite or contact with something which your skin reacts to.  Zyrtec daily may help some with itching.  Please use the topical cream I have provided to help with the symptoms and itching as well. I would expect this to improve over the next week.  If it increases in size, becomes pain, develops a pustule or fluid collection, or otherwise worsening please return.    ED Prescriptions    Medication Sig Dispense Auth. Provider   cetirizine (ZYRTEC) 10 MG tablet Take 1 tablet (10 mg total) by mouth daily. 30 tablet Linus Mako B, NP   triamcinolone cream (KENALOG) 0.1 % Apply 1 application topically 2 (two) times daily. 30 g Georgetta Haber, NP     PDMP not reviewed this encounter.   Georgetta Haber, NP 06/10/19 1046

## 2019-06-10 NOTE — Discharge Instructions (Signed)
This does not appear to be obviously an infection, likely an allergic response to either a bite or contact with something which your skin reacts to.  Zyrtec daily may help some with itching.  Please use the topical cream I have provided to help with the symptoms and itching as well. I would expect this to improve over the next week.  If it increases in size, becomes pain, develops a pustule or fluid collection, or otherwise worsening please return.

## 2019-06-10 NOTE — ED Triage Notes (Signed)
Pt presents with insect bites to left lower leg X 2 days.

## 2019-07-15 HISTORY — PX: WISDOM TOOTH EXTRACTION: SHX21

## 2019-10-23 ENCOUNTER — Emergency Department (HOSPITAL_COMMUNITY): Payer: Private Health Insurance - Indemnity

## 2019-10-23 ENCOUNTER — Encounter (HOSPITAL_COMMUNITY): Payer: Self-pay | Admitting: Emergency Medicine

## 2019-10-23 ENCOUNTER — Emergency Department (HOSPITAL_COMMUNITY)
Admission: EM | Admit: 2019-10-23 | Discharge: 2019-10-23 | Disposition: A | Discharge status: Home / Self Care | Payer: Private Health Insurance - Indemnity | Attending: Emergency Medicine | Admitting: Emergency Medicine

## 2019-10-23 DIAGNOSIS — R040 Epistaxis: Secondary | ICD-10-CM | POA: Diagnosis not present

## 2019-10-23 DIAGNOSIS — Z87891 Personal history of nicotine dependence: Secondary | ICD-10-CM | POA: Diagnosis not present

## 2019-10-23 DIAGNOSIS — Z79899 Other long term (current) drug therapy: Secondary | ICD-10-CM | POA: Insufficient documentation

## 2019-10-23 DIAGNOSIS — R5383 Other fatigue: Secondary | ICD-10-CM

## 2019-10-23 DIAGNOSIS — Z20822 Contact with and (suspected) exposure to covid-19: Secondary | ICD-10-CM | POA: Insufficient documentation

## 2019-10-23 DIAGNOSIS — J189 Pneumonia, unspecified organism: Secondary | ICD-10-CM | POA: Diagnosis not present

## 2019-10-23 LAB — COMPREHENSIVE METABOLIC PANEL
ALT: 30 U/L (ref 0–44)
AST: 39 U/L (ref 15–41)
Albumin: 3.7 g/dL (ref 3.5–5.0)
Alkaline Phosphatase: 114 U/L (ref 38–126)
Anion gap: 10 (ref 5–15)
BUN: 10 mg/dL (ref 6–20)
CO2: 24 mmol/L (ref 22–32)
Calcium: 9.3 mg/dL (ref 8.9–10.3)
Chloride: 103 mmol/L (ref 98–111)
Creatinine, Ser: 0.81 mg/dL (ref 0.44–1.00)
GFR calc Af Amer: >60 mL/min (ref >60)
GFR calc non Af Amer: >60 mL/min (ref >60)
Glucose, Bld: 92 mg/dL (ref 70–99)
Potassium: 3.8 mmol/L (ref 3.5–5.1)
Sodium: 137 mmol/L (ref 135–145)
Total Bilirubin: 0.2 mg/dL — ABNORMAL LOW (ref 0.3–1.2)
Total Protein: 7.7 g/dL (ref 6.5–8.1)

## 2019-10-23 LAB — CBC WITH DIFFERENTIAL/PLATELET
Abs Immature Granulocytes: 0.01 10*3/uL (ref 0.00–0.07)
Basophils Absolute: 0 10*3/uL (ref 0.0–0.1)
Basophils Relative: 1 %
Eosinophils Absolute: 0.2 10*3/uL (ref 0.0–0.5)
Eosinophils Relative: 5 %
HCT: 39.4 % (ref 36.0–46.0)
Hemoglobin: 12.1 g/dL (ref 12.0–15.0)
Immature Granulocytes: 0 %
Lymphocytes Relative: 35 %
Lymphs Abs: 1.7 10*3/uL (ref 0.7–4.0)
MCH: 24.8 pg — ABNORMAL LOW (ref 26.0–34.0)
MCHC: 30.7 g/dL (ref 30.0–36.0)
MCV: 80.9 fL (ref 80.0–100.0)
Monocytes Absolute: 0.8 10*3/uL (ref 0.1–1.0)
Monocytes Relative: 17 %
Neutro Abs: 2 10*3/uL (ref 1.7–7.7)
Neutrophils Relative %: 42 %
Platelets: 361 10*3/uL (ref 150–400)
RBC: 4.87 MIL/uL (ref 3.87–5.11)
RDW: 15.6 % — ABNORMAL HIGH (ref 11.5–15.5)
WBC: 4.7 10*3/uL (ref 4.0–10.5)
nRBC: 0 % (ref 0.0–0.2)

## 2019-10-23 LAB — SARS CORONAVIRUS 2 (TAT 6-24 HRS): SARS Coronavirus 2: NEGATIVE

## 2019-10-23 LAB — POC SARS CORONAVIRUS 2 AG -  ED: SARS Coronavirus 2 Ag: NEGATIVE

## 2019-10-23 LAB — I-STAT BETA HCG BLOOD, ED (MC, WL, AP ONLY): I-stat hCG, quantitative: <5 m[IU]/mL (ref <5)

## 2019-10-23 MED ORDER — AZITHROMYCIN 250 MG PO TABS: 250.0000 mg | ORAL_TABLET | Freq: Every day | ORAL | 0 refills | Status: DC

## 2019-10-23 MED ORDER — IBUPROFEN 400 MG PO TABS
600.0000 mg | ORAL_TABLET | Freq: Once | ORAL | Status: AC
  Administered 2019-10-23: 600 mg via ORAL
  Filled 2019-10-23: qty 1

## 2019-10-23 NOTE — ED Triage Notes (Signed)
Pt c/o fatigue, posterior cervical lymph node swelling, was told by dentist she has inflammation in her throat, nosebleeds, back pain and she feels the color of her nail beds are changing colors x 1 week.

## 2019-10-23 NOTE — ED Notes (Signed)
Pt provided graham crackers and ginger ale

## 2019-10-23 NOTE — ED Notes (Signed)
Dr little at bedside 

## 2019-10-23 NOTE — ED Provider Notes (Addendum)
[facility] EMERGENCY DEPARTMENT Provider Note   CSN: [id number] Arrival date & time: 10/23/19  1457     History Chief Complaint  Patient presents with  . Multiple complaints    Sandra Nicholson is a 34 y.o. female.  34 year old female who presents with multiple complaints.  Patient reports 1 week of swollen lymph node on the back of her neck at the base of her skull.  She notes that she went to the dentist for routine appointment this past week and was told that her throat looked inflamed although she denies any sore throat.  No dental infection/pain. She reports occasional nosebleeds.  Today she has been having thoracic back pain involving her posterior shoulders near her shoulder blades.  No injury or change in physical activity.  She is also concerned that the color over her nailbeds have been changing for the past week.  She reports generalized fatigue this week and some shortness of breath with exertion.  No recent travel, leg swelling, OCP use, history of cancer, or history of blood clots.  No urinary symptoms, URI symptoms, fevers, or weight loss.  The history is provided by the patient.       Past Medical History:  Diagnosis Date  . Allergy     There are no problems to display for this patient.   History reviewed. No pertinent surgical history.   OB History   No obstetric history on file.     Family History  Problem Relation Age of Onset  . Diabetes Father     Social History   Tobacco Use  . Smoking status: Former Games developer  . Smokeless tobacco: Never Used  Substance Use Topics  . Alcohol use: Yes    Alcohol/week: 0.0 standard drinks    Comment: occ  . Drug use: No    Home Medications Prior to Admission medications   Medication Sig Start Date End Date Taking? Authorizing Provider  acetaminophen (TYLENOL) 500 MG tablet Take 1,000 mg by mouth every 6 (six) hours as needed for headache (pain).   Yes [provider]    Aspirin-Acetaminophen-Caffeine (GOODY HEADACHE PO) Take 1 packet by mouth 2 (two) times daily as needed (pain).   Yes [provider]  Multiple Vitamin (MULTIVITAMIN WITH MINERALS) TABS tablet Take 1 tablet by mouth daily.   Yes [provider]  azithromycin (ZITHROMAX Z-PAK) 250 MG tablet Take 1 tablet (250 mg total) by mouth daily. 500mg  PO day 1, then 250mg  PO days 205 10/23/19   Claryssa Sandner, , MD    Allergies    Fish allergy and Macadamia nut oil  Review of Systems   Review of Systems All other systems reviewed and are negative except that which was mentioned in HPI  Physical Exam Updated Vital Signs BP 95/71   Pulse 67   Temp 98.1 F (36.7 C) (Oral)   Resp 17   Ht 5\' 7"  (1.702 m)   Wt 63.5 kg   LMP 10/09/2019   SpO2 99%   BMI 21.93 kg/m   Physical Exam Vitals and nursing note reviewed.  Constitutional:      General: She is not in acute distress.    Appearance: She is well-developed.  HENT:     Head: Normocephalic and atraumatic.     Mouth/Throat:     Mouth: Mucous membranes are moist.     Pharynx: Oropharynx is clear.  Eyes:     Conjunctiva/sclera: Conjunctivae normal.  Neck:     Comments: 2cm  non-tender lymph node on L posterior neck at base of skull, 0.5cm lymph node on R posterior neck Cardiovascular:     Rate and Rhythm: Normal rate and regular rhythm.     Heart sounds: Normal heart sounds. No murmur.  Pulmonary:     Effort: Pulmonary effort is normal.     Breath sounds: Normal breath sounds.  Abdominal:     General: Bowel sounds are normal. There is no distension.     Palpations: Abdomen is soft.     Tenderness: There is no abdominal tenderness.  Musculoskeletal:     Cervical back: Neck supple. No rigidity.     Right lower leg: No edema.     Left lower leg: No edema.  Skin:    General: Skin is warm and dry.     Comments: No abnormalities of nailbeds on hands  Neurological:     Mental Status: She is alert and oriented to  person, place, and time.     Comments: Fluent speech  Psychiatric:        Judgment: Judgment normal.     ED Results / Procedures / Treatments   Labs (all labs ordered are listed, but only abnormal results are displayed) Labs Reviewed  COMPREHENSIVE METABOLIC PANEL - Abnormal; Notable for the following components:      Result Value   Total Bilirubin 0.2 (*)    All other components within normal limits  CBC WITH DIFFERENTIAL/PLATELET - Abnormal; Notable for the following components:   MCH 24.8 (*)    RDW 15.6 (*)    All other components within normal limits  SARS CORONAVIRUS 2 (TAT 6-24 HRS)  I-STAT BETA HCG BLOOD, ED ([facility], [facility], [facility] ONLY)  POC SARS CORONAVIRUS 2 AG -  ED    EKG EKG Interpretation  Date/Time:  Sunday October 23 2019 17:17:49 EDT Ventricular Rate:  62 PR Interval:    QRS Duration: 73 QT Interval:  373 QTC Calculation: 379 R Axis:   62 Text Interpretation: Sinus rhythm No previous ECGs available Confirmed by Frederick Peers [phone]) on 10/23/2019 5:46:21 PM   Radiology DG Chest 2 View  Result Date: 10/23/2019 CLINICAL DATA:  Shortness of breath, fatigue EXAM: CHEST - 2 VIEW COMPARISON:  None. FINDINGS: The heart size and mediastinal contours are within normal limits. Hazy airspace opacity within the peripheral aspects of the right upper lobe and right lower lobe. There is also a subtle increased interstitial prominence within the left lung apex and left lung base. No pleural effusion or pneumothorax. The visualized skeletal structures are unremarkable. There is a rounded hyperdense structure partially visualized at the base of the left neck seen only on frontal view, which may be external to the patient. IMPRESSION: Hazy airspace opacity within the peripheral aspects of the right upper lobe and right lower lobe suspicious for multifocal atypical/viral pneumonia, including COVID-19 pneumonia. There is also a subtle increased interstitial prominence within the left lung apex  and left lung base. Findings could reflect additional site of infection versus inflammation. Electronically Signed   By: Duanne Guess D.O.   On: 10/23/2019 16:59    Procedures Procedures (including critical care time)  Medications Ordered in ED Medications  ibuprofen (ADVIL) tablet 600 mg (600 mg Oral Given 10/23/19 2049)    ED Course  I have reviewed the triage vital signs and the nursing notes.  Pertinent labs & imaging results that were available during my care of the patient were reviewed by me and considered in my medical decision  making (see chart for details).    MDM Rules/Calculators/A&P                      Pt well appearing,  normal VS. PERC negative therefore PE very unlikely.  Lab work including CMP and CBC is reassuring.  Chest x-ray shows multifocal atypical pneumonia.  Given the patient's fatigue, muscle pain, and lymphadenopathy, I suspect COVID-19.  Her rapid Covid test was negative, send out test is pending.  I have discussed strict quarantine until results are available and discussed what to do based on test results.  Because of her abnormal chest x-ray, I did give a prescription for azithromycin and instructed to take this medication only if her Covid test is negative.  Recommended PCP follow-up regarding her other symptoms.  Return precautions reviewed and she voiced understanding.  Sandra Nicholson was evaluated in Emergency Department on 10/23/2019 for the symptoms described in the history of present illness. She was evaluated in the context of the global COVID-19 pandemic, which necessitated consideration that the patient might be at risk for infection with the SARS-CoV-2 virus that causes COVID-19. Institutional protocols and algorithms that pertain to the evaluation of patients at risk for COVID-19 are in a state of rapid change based on information released by regulatory bodies including the CDC and federal and state organizations. These policies and algorithms were  followed during the patient's care in the ED.  Final Clinical Impression(s) / ED Diagnoses Final diagnoses:  Suspected COVID-19 virus infection  Fatigue, unspecified type  Atypical pneumonia    Rx / DC Orders ED Discharge Orders         Ordered    azithromycin (ZITHROMAX Z-PAK) 250 MG tablet  Daily     10/23/19 2047           Mordechai Matuszak, Wenda Overland, MD 10/23/19 2056    Rex Kras, Wenda Overland, MD 10/23/19 2104

## 2019-10-23 NOTE — Discharge Instructions (Signed)
IF YOUR COVID-`19 TEST IS POSITIVE, YOU HAVE COVID AND YOU DO NOT NEED TO TAKE THE ANTIBIOTICS. IF YOUR COVID-19 TEST IS NEGATIVE, START AZITHROMYCIN PRESCRIPTION.

## 2019-10-23 NOTE — ED Notes (Signed)
Pt requesting pain medication for back pain and for food.

## 2019-10-23 NOTE — ED Notes (Signed)
Pt verbalized understanding of d/c instructions, medications and follow up. Pt understands COVID precautions and how to set up mychart for covid swab results. Pt ambulatory to WR with steady gait

## 2020-01-08 ENCOUNTER — Other Ambulatory Visit: Payer: Self-pay

## 2020-01-08 ENCOUNTER — Emergency Department (HOSPITAL_COMMUNITY)
Admission: EM | Admit: 2020-01-08 | Discharge: 2020-01-08 | Disposition: A | Discharge status: Home / Self Care | Payer: Private Health Insurance - Indemnity | Attending: Emergency Medicine | Admitting: Emergency Medicine

## 2020-01-08 ENCOUNTER — Encounter (HOSPITAL_COMMUNITY): Payer: Self-pay | Admitting: Emergency Medicine

## 2020-01-08 DIAGNOSIS — R591 Generalized enlarged lymph nodes: Secondary | ICD-10-CM | POA: Insufficient documentation

## 2020-01-08 DIAGNOSIS — Z20822 Contact with and (suspected) exposure to covid-19: Secondary | ICD-10-CM | POA: Insufficient documentation

## 2020-01-08 DIAGNOSIS — Z79899 Other long term (current) drug therapy: Secondary | ICD-10-CM | POA: Insufficient documentation

## 2020-01-08 DIAGNOSIS — J029 Acute pharyngitis, unspecified: Secondary | ICD-10-CM | POA: Diagnosis present

## 2020-01-08 DIAGNOSIS — Z87891 Personal history of nicotine dependence: Secondary | ICD-10-CM | POA: Diagnosis not present

## 2020-01-08 LAB — MONONUCLEOSIS SCREEN: Mono Screen: NEGATIVE

## 2020-01-08 LAB — SARS CORONAVIRUS 2 BY RT PCR (HOSPITAL ORDER, PERFORMED IN ~~LOC~~ HOSPITAL LAB): SARS Coronavirus 2: NEGATIVE

## 2020-01-08 LAB — GROUP A STREP BY PCR: Group A Strep by PCR: NOT DETECTED

## 2020-01-08 MED ORDER — ACETAMINOPHEN 500 MG PO TABS
1000.0000 mg | ORAL_TABLET | Freq: Once | ORAL | Status: AC
  Administered 2020-01-08: 1000 mg via ORAL
  Filled 2020-01-08: qty 2

## 2020-01-08 NOTE — ED Notes (Signed)
Patient verbalizes understanding of discharge instructions. Opportunity for questioning and answers were provided. Armband removed by staff, pt discharged from ED. Pt. ambulatory and discharged home.  

## 2020-01-08 NOTE — Discharge Instructions (Signed)
Your strep test, Covid test and mono test today were negative.  Given the you have persistent lymph node swelling for 2 months he will need to have further work-up of this.  You were given information to follow-up with your regular doctor.  You will need to call the office to schedule an appointment for follow-up to further evaluate this symptom.  Please return to the emergency department immediately if any new or worsening symptoms develop

## 2020-01-08 NOTE — ED Provider Notes (Signed)
[facility] EMERGENCY DEPARTMENT Provider Note   CSN: [id number] Arrival date & time: 01/08/20  1437     History Chief Complaint  Patient presents with  . Sore Throat    Sandra Nicholson is a 34 y.o. female.  HPI   Pt is a 31 y/o F who presents to the ED tdoay for eval of lymphadenopathy that started 2 months ago. Lymph nodes are swollen to the occipital area and to the right side of the neck. States sxs started after a dental procedure. States she has had persistent swelling  States she had an allergic reaction to shrimp/shellfish 1 week ago and since then she has had a sore throat. She has allergies and so has chronic rhinorrhea/congestion. She has also had some mucous in the back of her throat. Denies a cough. Denies any fevers or fatigue.   Denies COVID exposures. Has not had vaccines.   Past Medical History:  Diagnosis Date  . Allergy     There are no problems to display for this patient.   History reviewed. No pertinent surgical history.   OB History   No obstetric history on file.     Family History  Problem Relation Age of Onset  . Diabetes Father     Social History   Tobacco Use  . Smoking status: Former Games developer  . Smokeless tobacco: Never Used  Substance Use Topics  . Alcohol use: Yes    Alcohol/week: 0.0 standard drinks    Comment: occ  . Drug use: No    Home Medications Prior to Admission medications   Medication Sig Start Date End Date Taking? Authorizing Provider  acetaminophen (TYLENOL) 500 MG tablet Take 1,000 mg by mouth every 6 (six) hours as needed for headache (pain).    [provider]  Aspirin-Acetaminophen-Caffeine (GOODY HEADACHE PO) Take 1 packet by mouth 2 (two) times daily as needed (pain).    [provider]  azithromycin (ZITHROMAX Z-PAK) 250 MG tablet Take 1 tablet (250 mg total) by mouth daily. 500mg  PO day 1, then 250mg  PO days 205 10/23/19   Little, , MD  Multiple Vitamin  (MULTIVITAMIN WITH MINERALS) TABS tablet Take 1 tablet by mouth daily.    [provider]    Allergies    Fish allergy and Macadamia nut oil  Review of Systems   Review of Systems  Constitutional: Negative for fatigue and fever.       Denies weight loss  HENT: Positive for congestion (chronic), postnasal drip, rhinorrhea (chronic) and sore throat. Negative for ear pain.   Eyes: Negative for visual disturbance.  Respiratory: Negative for cough and shortness of breath.   Cardiovascular: Negative for chest pain.  Gastrointestinal: Negative for abdominal pain, constipation, diarrhea, nausea and vomiting.  Genitourinary: Negative for dysuria and hematuria.  Musculoskeletal: Negative for back pain.  Skin: Negative for rash.  Neurological: Negative for headaches.  Hematological: Positive for adenopathy.  All other systems reviewed and are negative.   Physical Exam Updated Vital Signs BP 118/83 (BP Location: Left Arm)   Pulse 98   Temp 100 F (37.8 C) (Oral)   Resp 12   Ht 5\' 7"  (1.702 m)   Wt 63.5 kg   LMP 01/01/2020   SpO2 98%   BMI 21.93 kg/m   Physical Exam Vitals and nursing note reviewed.  Constitutional:      General: She is not in acute distress.    Appearance: She is well-developed.  HENT:  Head: Normocephalic and atraumatic.     Mouth/Throat:     Mouth: Mucous membranes are moist.     Pharynx: Uvula midline. Posterior oropharyngeal erythema present. No oropharyngeal exudate.     Tonsils: No tonsillar exudate or tonsillar abscesses. 1+ on the right. 1+ on the left.  Eyes:     Conjunctiva/sclera: Conjunctivae normal.  Cardiovascular:     Rate and Rhythm: Normal rate and regular rhythm.     Heart sounds: No murmur heard.   Pulmonary:     Effort: Pulmonary effort is normal. No respiratory distress.     Breath sounds: Normal breath sounds.  Abdominal:     Palpations: Abdomen is soft.     Tenderness: There is no abdominal tenderness.    Musculoskeletal:     Cervical back: Neck supple.  Lymphadenopathy:     Head:     Right side of head: Occipital adenopathy present.     Left side of head: Occipital adenopathy present.     Cervical: Cervical adenopathy present.     Upper Body:     Right upper body: No supraclavicular adenopathy.     Left upper body: No supraclavicular adenopathy.     Comments: Lymphadenopathy is nontender  Skin:    General: Skin is warm and dry.  Neurological:     Mental Status: She is alert.     ED Results / Procedures / Treatments   Labs (all labs ordered are listed, but only abnormal results are displayed) Labs Reviewed  GROUP A STREP BY PCR  SARS CORONAVIRUS 2 BY RT PCR (HOSPITAL ORDER, [facility])  MONONUCLEOSIS SCREEN    EKG None  Radiology No results found.  Procedures Procedures (including critical care time)  Medications Ordered in ED Medications  acetaminophen (TYLENOL) tablet 1,000 mg (1,000 mg Oral Given 01/08/20 1751)    ED Course  I have reviewed the triage vital signs and the nursing notes.  Pertinent labs & imaging results that were available during my care of the patient were reviewed by me and considered in my medical decision making (see chart for details).    MDM Rules/Calculators/A&P                           34 y/o F presenting for eval of sore throat x1 week. Also c/o lymphadenopathy x2 months that is nonpainful. Does not have a PCP.   On exam patient has some pharyngeal erythema without any unilateral tonsillar swelling or exudate.  No uvular swelling and uvula midline.  She does have some cervical adenopathy and occipital adenopathy that is nontender.  No evidence of deep space infection.  Covid, strep and mono test are all negative.  I discussed that given her prolonged painless lymphadenopathy she will need further work-up as an outpatient and may need to undergo lymph node biopsy.  I consulted with case management who  evaluated the patient at bedside and gave information for follow-up to the community health and wellness clinic.  She also raised out to the clinic to help facilitate the next mediated appointment.  Have advised patient to return to the ED for new or worsening symptoms in the meantime.  She voices understanding of the plan and reasons to return.  All questions answered.  Patient stable for discharge.  Final Clinical Impression(s) / ED Diagnoses Final diagnoses:  Pharyngitis, unspecified etiology  Lymphadenopathy    Rx / DC Orders ED Discharge Orders  None       Rayne Du 01/08/20 1933    Margarita Grizzle, MD 01/09/20 1301

## 2020-01-08 NOTE — ED Triage Notes (Signed)
Pt states she was seen over a month ago for swollen lymph nodes in neck and told to return if they didn't improve.  Reports lymph nodes still swollen and having sore throat x 1 week.

## 2020-01-08 NOTE — ED Notes (Signed)
Called patient to move to room, unable to locate at this time. 

## 2020-01-09 ENCOUNTER — Telehealth: Payer: Self-pay

## 2020-01-09 NOTE — Telephone Encounter (Signed)
Message received from Gilda Crease, [place] requesting a hospital follow up appointment for the patient.   Call placed to patient # [phone], message left with call back requested to this CM.    Need to discuss scheduling a follow up appt.

## 2020-01-10 NOTE — Telephone Encounter (Signed)
Call returned to patient # [phone], message left with call back requested to this CM

## 2020-01-10 NOTE — Telephone Encounter (Signed)
Call received from the patient.  She has an appointment scheduled for a hospital follow up on 01/26/2020 @ [facility].  She was requesting an appointment sooner to be evaluated for her lymphadenopathy.  Explained to her that she could be seen earlier at the [facility] or at [facility] as early as 01/12/2020.   She preferred to wait for an appt at [facility] and asked that she be placed on a call list for cancellations.  Explained to her that she can always call the clinic to check for last minute cancellations and she said she understood.    Aldean Jewett, can this patient be placed on the list to be called if there are cancellations?

## 2020-01-10 NOTE — Telephone Encounter (Signed)
Pt returned call to Robyne Peers. Confirmed appt on file. Please return call to pt.

## 2020-01-25 NOTE — Progress Notes (Deleted)
Patient ID: MLISS WEDIN, female   DOB: January 09, 1986, 34 y.o.   MRN: [id number]   After ED visit 01/08/2020 for lymphadenopathy:  From ED A/P: 34 y/o F presenting for eval of sore throat x1 week. Also c/o lymphadenopathy x2 months that is nonpainful. Does not have a PCP.   On exam patient has some pharyngeal erythema without any unilateral tonsillar swelling or exudate.  No uvular swelling and uvula midline.  She does have some cervical adenopathy and occipital adenopathy that is nontender.  No evidence of deep space infection.  Covid, strep and mono test are all negative.  I discussed that given her prolonged painless lymphadenopathy she will need further work-up as an outpatient and may need to undergo lymph node biopsy.  I consulted with case management who evaluated the patient at bedside and gave information for follow-up to the community health and wellness clinic.  She also raised out to the clinic to help facilitate the next mediated appointment.  Have advised patient to return to the ED for new or worsening symptoms in the meantime.  She voices understanding of the plan and reasons to return.  All questions answered.  Patient stable for discharge.

## 2020-01-26 ENCOUNTER — Inpatient Hospital Stay: Payer: Private Health Insurance - Indemnity | Admitting: Physician Assistant

## 2020-02-06 ENCOUNTER — Encounter (HOSPITAL_COMMUNITY): Payer: Self-pay

## 2020-02-06 ENCOUNTER — Other Ambulatory Visit: Payer: Self-pay

## 2020-02-06 ENCOUNTER — Ambulatory Visit (HOSPITAL_COMMUNITY)
Admission: RE | Admit: 2020-02-06 | Discharge: 2020-02-06 | Disposition: A | Discharge status: Home / Self Care | Payer: Private Health Insurance - Indemnity | Source: Ambulatory Visit | Attending: Family Medicine | Admitting: Family Medicine

## 2020-02-06 VITALS — BP 104/80 | HR 92 | Temp 99.5°F | Resp 18

## 2020-02-06 DIAGNOSIS — Z87891 Personal history of nicotine dependence: Secondary | ICD-10-CM | POA: Diagnosis not present

## 2020-02-06 DIAGNOSIS — J358 Other chronic diseases of tonsils and adenoids: Secondary | ICD-10-CM | POA: Insufficient documentation

## 2020-02-06 DIAGNOSIS — J029 Acute pharyngitis, unspecified: Secondary | ICD-10-CM | POA: Insufficient documentation

## 2020-02-06 DIAGNOSIS — Z20822 Contact with and (suspected) exposure to covid-19: Secondary | ICD-10-CM | POA: Insufficient documentation

## 2020-02-06 DIAGNOSIS — R591 Generalized enlarged lymph nodes: Secondary | ICD-10-CM | POA: Insufficient documentation

## 2020-02-06 NOTE — ED Triage Notes (Signed)
Soreness particularly on left side of throat, then started coughing.  Symptoms started 2 days ago.  Patient has had swollen lymph nodes

## 2020-02-06 NOTE — ED Notes (Signed)
covid swab obtained, verified labeling, liquid in tube, lid secure, and placed in lab

## 2020-02-06 NOTE — ED Provider Notes (Addendum)
[facility]    CSN: [id number] Arrival date & time: 02/06/20  1855       History   Chief Complaint Chief Complaint  Patient presents with  . Sore Throat  . Appointment    7pm    HPI Sandra Nicholson is a 34 y.o. female.   Pt complains of persistent painless lymphadenopathy that started about three months ago. She reports sx started after dental procedures.  She also complains of bad breath and "stones" that have come out of left tonsil.  Pt reports she has tried ibuprofen with minimal relief.  She was seen in the ED one month ago, COVID negative, strep negative, mono negative.  No change in sx since that visit.  She has an upcoming appointment with PCP to establish care and for further evaluation of three months long painless lymphadenopathy.   Pt requests a COVID test today.  She denies recent known exposure, but is concerned regarding current sx.       Past Medical History:  Diagnosis Date  . Allergy     There are no problems to display for this patient.   History reviewed. No pertinent surgical history.  OB History   No obstetric history on file.      Home Medications    Prior to Admission medications   Medication Sig Start Date End Date Taking? Authorizing Provider  chlorhexidine (PERIDEX) 0.12 % solution SMARTSIG:By Mouth 12/26/19  Yes [provider]  acetaminophen (TYLENOL) 500 MG tablet Take 1,000 mg by mouth every 6 (six) hours as needed for headache (pain).    [provider]  Aspirin-Acetaminophen-Caffeine (GOODY HEADACHE PO) Take 1 packet by mouth 2 (two) times daily as needed (pain).    [provider]  azithromycin (ZITHROMAX Z-PAK) 250 MG tablet Take 1 tablet (250 mg total) by mouth daily. 500mg  PO day 1, then 250mg  PO days 205 10/23/19   Little, , MD  Multiple Vitamin (MULTIVITAMIN WITH MINERALS) TABS tablet Take 1 tablet by mouth daily.    [provider]    Family History Family History    Problem Relation Age of Onset  . Diabetes Father     Social History Social History   Tobacco Use  . Smoking status: Former 12/23/19  . Smokeless tobacco: Never Used  Substance Use Topics  . Alcohol use: Yes    Alcohol/week: 0.0 standard drinks    Comment: occ  . Drug use: No     Allergies   Fish allergy and Macadamia nut oil   Review of Systems Review of Systems  Constitutional: Negative for chills and fever.  HENT: Negative for ear pain, sore throat, trouble swallowing and voice change.        Tonsil stones  Eyes: Negative for pain and visual disturbance.  Respiratory: Negative for cough and shortness of breath.   Cardiovascular: Negative for chest pain and palpitations.  Gastrointestinal: Negative for abdominal pain and vomiting.  Genitourinary: Negative for dysuria and hematuria.  Musculoskeletal: Negative for arthralgias, back pain and neck pain.  Skin: Negative for color change and rash.  Neurological: Negative for seizures and syncope.  Hematological: Positive for adenopathy.  All other systems reviewed and are negative.    Physical Exam Triage Vital Signs ED Triage Vitals  Enc Vitals Group     BP 02/06/20 1924 104/80     Pulse Rate 02/06/20 1924 92     Resp 02/06/20 1924 18     Temp 02/06/20 1924 99.5 F (  37.5 C)     Temp Source 02/06/20 1924 Oral     SpO2 02/06/20 1924 100 %     Weight --      Height --      Head Circumference --      Peak Flow --      Pain Score 02/06/20 1920 7     Pain Loc --      Pain Edu? --      Excl. in GC? --    No data found.  Updated Vital Signs BP 104/80 (BP Location: Left Arm)   Pulse 92   Temp 99.5 F (37.5 C) (Oral)   Resp 18   LMP 12/17/2019   SpO2 100%   Visual Acuity Right Eye Distance:   Left Eye Distance:   Bilateral Distance:    Right Eye Near:   Left Eye Near:    Bilateral Near:     Physical Exam Vitals and nursing note reviewed.  Constitutional:      General: She is not in acute  distress.    Appearance: She is well-developed.  HENT:     Head: Normocephalic and atraumatic.     Mouth/Throat:     Pharynx: Oropharynx is clear. Uvula midline. Posterior oropharyngeal erythema (mild erythema to bilateral tonsils, tonsil stone noted to rigth tonsil) present. No pharyngeal swelling, oropharyngeal exudate or uvula swelling.     Tonsils: No tonsillar exudate or tonsillar abscesses.  Eyes:     Conjunctiva/sclera: Conjunctivae normal.  Neck:     Comments: Enlarged, mobile, non tender occipital and cervical lymphadenopathy  Cardiovascular:     Rate and Rhythm: Normal rate and regular rhythm.     Heart sounds: No murmur heard.   Pulmonary:     Effort: Pulmonary effort is normal. No respiratory distress.     Breath sounds: Normal breath sounds.  Abdominal:     Palpations: Abdomen is soft.     Tenderness: There is no abdominal tenderness.  Musculoskeletal:     Cervical back: Full passive range of motion without pain, normal range of motion and neck supple.  Lymphadenopathy:     Cervical: Cervical adenopathy present.  Skin:    General: Skin is warm and dry.  Neurological:     Mental Status: She is alert.      UC Treatments / Results  Labs (all labs ordered are listed, but only abnormal results are displayed) Labs Reviewed  SARS CORONAVIRUS 2 (TAT 6-24 HRS)    EKG   Radiology No results found.  Procedures Procedures (including critical care time)  Medications Ordered in UC Medications - No data to display  Initial Impression / Assessment and Plan / UC Course  I have reviewed the triage vital signs and the nursing notes.  Pertinent labs & imaging results that were available during my care of the patient were reviewed by me and considered in my medical decision making (see chart for details).     Pt seen in ED one month ago for similar sx.  Appointment made to establish care with PCP, upcoming in a few weeks for further evaluation and possible biopsy of  persistent non painful lymphadenopathy.  She also complains of tonsil stones reporting she has noted small stones with associated bad breath.  Advised to keep upcoming appointment.  Return precautions given.  Final Clinical Impressions(s) / UC Diagnoses   Final diagnoses:  Tonsillolith  Lymphadenopathy     Discharge Instructions     Recommend you keep your appointment with  PCP for evaluation of persistent painless lymphadenopathy and tonsil stones.     ED Prescriptions    None     PDMP not reviewed this encounter.   Jodell Cipro, PA-C 02/06/20 2003    [address], PA-C 02/06/20 2010

## 2020-02-06 NOTE — Discharge Instructions (Addendum)
Recommend you keep your appointment with PCP for evaluation of persistent painless lymphadenopathy and tonsil stones.

## 2020-02-07 LAB — SARS CORONAVIRUS 2 (TAT 6-24 HRS): SARS Coronavirus 2: NEGATIVE

## 2020-02-09 NOTE — Progress Notes (Signed)
Patient ID: Sandra Nicholson, female   DOB: 10-30-1985, 34 y.o.   MRN: [id number]     Sandra Nicholson, is a 34 y.o. female  [id number]  [id number]  DOB - 22-Jun-1986  Subjective:  Chief Complaint and HPI: Sandra Nicholson is a 34 y.o. female here today to establish care and for a follow up visit Seen in ED for tonsillith and lymphadenopathy 02/06/2020.  She describes to me that she initially started having LN in her neck after having her teeth cleaned in march.  She now has LN in the back of her neck, front of her neck, side of her neck and supraclavicular area.  She did have temp at ED visit.  She has been having night sweats.  LN are persistent and worsening.  strep and mono tests neg.  Appetite is good.  No weight loss.  +weight gain.  No N/V since vomiting 02/06/2020.  +fatigue  ED/Hospital notes reviewed.   Family history:  No FH lymphoma  ROS:   Constitutional:  No f/c, + night sweats, No unexplained weight loss. EENT:  No vision changes, No blurry vision, No hearing changes. No mouth or ear problems.   + on and off ST Respiratory: No cough, No SOB Cardiac: No CP, no palpitations GI:  No abd pain, No N/V/D. GU: No Urinary s/sx Musculoskeletal: No joint pain Neuro: No headache, no dizziness, no motor weakness.  Skin: No rash Endocrine:  No polydipsia. No polyuria.  Psych: Denies SI/HI  No problems updated.  ALLERGIES: Allergies  Allergen Reactions  . Fish Allergy Itching and Swelling    Mouth and throat swelling  . Macadamia Nut Oil Itching and Swelling    Mouth and throat swelling    PAST MEDICAL HISTORY: Past Medical History:  Diagnosis Date  . Allergy     MEDICATIONS AT HOME: Prior to Admission medications   Medication Sig Start Date End Date Taking? Authorizing Provider  acetaminophen (TYLENOL) 500 MG tablet Take 1,000 mg by mouth every 6 (six) hours as needed for headache (pain).    [provider]  Aspirin-Acetaminophen-Caffeine (GOODY HEADACHE PO)  Take 1 packet by mouth 2 (two) times daily as needed (pain).    [provider]  azithromycin (ZITHROMAX Z-PAK) 250 MG tablet Take 1 tablet (250 mg total) by mouth daily. 500mg  PO day 1, then 250mg  PO days 205 02/15/20   M, PA-C  chlorhexidine (PERIDEX) 0.12 % solution SMARTSIG:By Mouth 12/26/19   [provider]  Multiple Vitamin (MULTIVITAMIN WITH MINERALS) TABS tablet Take 1 tablet by mouth daily.    [provider]     Objective:  EXAM:   Vitals:   02/15/20 1401  BP: 108/76  Pulse: 76  Resp: 16  SpO2: 100%  Weight: 158 lb (71.7 kg)  Height: 5\' 7"  (1.702 m)    General appearance : A&OX3. NAD. Non-toxic-appearing HEENT: Atraumatic and Normocephalic.  PERRLA. EOM intact.  TM clear B. Mouth-MMM, post pharynx WNL w/ erythema, No PND.  There is a tonsillith on the L Neck: supple, no JVD. Large and firm AC nodes 2-3cm fixed B, also B occipital nodes, and B supraclavicular and preauricular nodes.  All large, firm and not mobile.  No meningismus Chest/Lungs:  Breathing-non-labored, Good air entry bilaterally, breath sounds normal without rales, rhonchi, or wheezing  CVS: S1 S2 regular, no murmurs, gallops, rubs  Abdomen: Bowel sounds present, Non tender and not distended with no gaurding, rigidity or rebound. No hepatosplenomegaly No axillary or inguinal LN  Extremities: Bilateral Lower Ext shows no edema, both legs are warm to touch with = pulse throughout Neurology:  CN II-XII grossly intact, Non focal.   Psych:  TP linear. J/I WNL. Normal speech. Appropriate eye contact and affect.  Skin:  No Rash  Data Review No results found for: HGBA1C   Assessment & Plan   1. Lymphadenopathy of head and neck Nodes are firm and fixed.  Given time frame of persisting and worsening for several months; concerning... -HIV with reflex - CBC with Differential/Platelet - CMV abs, IgG+IgM (cytomegalovirus) - Mononucleosis Test, Qual W/ Reflex - [place] SOFT  TISSUE HEAD & NECK (NON-THYROID); Future  2. Unexplained night sweats - Ambulatory referral to Hematology - US SOFT TISSUE HEAD & NECK (NON-THYROID); Future  3. Sore throat Warm salt water gargles, ibuprofen/tylenol - CBC with Differential/Platelet - CMV abs, IgG+IgM (cytomegalovirus) - Mononucleosis Test, Qual W/ Reflex - azithromycin (ZITHROMAX Z-PAK) 250 MG tablet; Take 1 tablet (250 mg total) by mouth daily. 500mg  PO day 1, then 250mg  PO days 205  Dispense: 6 tablet; Refill: 0  4. Hospital discharge follow-up Not improving Discussed with Dr .  Patient have been counseled extensively about nutrition and exercise  Return in about 6 weeks (around 03/28/2020) for assign PCP.  The patient was given clear instructions to go to ER or return to medical center if symptoms don't improve, worsen or new problems develop. The patient verbalized understanding. The patient was told to call to get lab results if they haven't heard anything in the next week.     Alvis Lemmings, PA-C [facility] [place], [facility] [place]   02/15/2020, 2:54 PM

## 2020-02-15 ENCOUNTER — Encounter: Payer: Self-pay | Admitting: Physician Assistant

## 2020-02-15 ENCOUNTER — Other Ambulatory Visit: Payer: Self-pay

## 2020-02-15 ENCOUNTER — Ambulatory Visit: Payer: Private Health Insurance - Indemnity | Attending: Physician Assistant | Admitting: Physician Assistant

## 2020-02-15 VITALS — BP 108/76 | HR 76 | Resp 16 | Ht 67.0 in | Wt 158.0 lb

## 2020-02-15 DIAGNOSIS — J029 Acute pharyngitis, unspecified: Secondary | ICD-10-CM | POA: Diagnosis not present

## 2020-02-15 DIAGNOSIS — Z09 Encounter for follow-up examination after completed treatment for conditions other than malignant neoplasm: Secondary | ICD-10-CM

## 2020-02-15 DIAGNOSIS — R61 Generalized hyperhidrosis: Secondary | ICD-10-CM

## 2020-02-15 DIAGNOSIS — R591 Generalized enlarged lymph nodes: Secondary | ICD-10-CM | POA: Diagnosis not present

## 2020-02-15 MED ORDER — AZITHROMYCIN 250 MG PO TABS: 250.0000 mg | ORAL_TABLET | Freq: Every day | ORAL | 0 refills | Status: DC

## 2020-02-16 LAB — CMV ABS, IGG+IGM (CYTOMEGALOVIRUS)
CMV Ab - IgG: <0.6 U/mL (ref 0.00–0.59)
CMV IgM Ser EIA-aCnc: <30 AU/mL (ref 0.0–29.9)

## 2020-02-16 LAB — CBC WITH DIFFERENTIAL/PLATELET
Basophils Absolute: 0 10*3/uL (ref 0.0–0.2)
Basos: 1 %
EOS (ABSOLUTE): 0.2 10*3/uL (ref 0.0–0.4)
Eos: 4 %
Hematocrit: 34.2 % (ref 34.0–46.6)
Hemoglobin: 10.8 g/dL — ABNORMAL LOW (ref 11.1–15.9)
Immature Grans (Abs): 0 10*3/uL (ref 0.0–0.1)
Immature Granulocytes: 0 %
Lymphocytes Absolute: 1.4 10*3/uL (ref 0.7–3.1)
Lymphs: 32 %
MCH: 24.1 pg — ABNORMAL LOW (ref 26.6–33.0)
MCHC: 31.6 g/dL (ref 31.5–35.7)
MCV: 76 fL — ABNORMAL LOW (ref 79–97)
Monocytes Absolute: 0.7 10*3/uL (ref 0.1–0.9)
Monocytes: 16 %
Neutrophils Absolute: 2.2 10*3/uL (ref 1.4–7.0)
Neutrophils: 47 %
Platelets: 400 10*3/uL (ref 150–450)
RBC: 4.49 x10E6/uL (ref 3.77–5.28)
RDW: 16.1 % — ABNORMAL HIGH (ref 11.7–15.4)
WBC: 4.5 10*3/uL (ref 3.4–10.8)

## 2020-02-16 LAB — MONO QUAL W/RFLX QN: Mono Qual W/Rflx Qn: NEGATIVE

## 2020-02-23 ENCOUNTER — Ambulatory Visit (HOSPITAL_COMMUNITY): Payer: Private Health Insurance - Indemnity

## 2020-02-24 ENCOUNTER — Other Ambulatory Visit: Payer: Self-pay

## 2020-02-24 ENCOUNTER — Ambulatory Visit (HOSPITAL_COMMUNITY)
Admission: RE | Admit: 2020-02-24 | Discharge: 2020-02-24 | Disposition: A | Discharge status: Home / Self Care | Payer: Self-pay | Source: Ambulatory Visit | Attending: Physician Assistant | Admitting: Physician Assistant

## 2020-02-24 DIAGNOSIS — R61 Generalized hyperhidrosis: Secondary | ICD-10-CM | POA: Insufficient documentation

## 2020-02-24 DIAGNOSIS — R591 Generalized enlarged lymph nodes: Secondary | ICD-10-CM | POA: Insufficient documentation

## 2020-02-29 ENCOUNTER — Other Ambulatory Visit: Payer: Self-pay | Admitting: Physician Assistant

## 2020-02-29 DIAGNOSIS — R591 Generalized enlarged lymph nodes: Secondary | ICD-10-CM

## 2020-02-29 DIAGNOSIS — J029 Acute pharyngitis, unspecified: Secondary | ICD-10-CM

## 2020-02-29 DIAGNOSIS — R61 Generalized hyperhidrosis: Secondary | ICD-10-CM

## 2020-02-29 DIAGNOSIS — R9389 Abnormal findings on diagnostic imaging of other specified body structures: Secondary | ICD-10-CM

## 2020-03-02 ENCOUNTER — Telehealth: Payer: Self-pay | Admitting: Hematology

## 2020-03-02 NOTE — Telephone Encounter (Signed)
Received a pt referral from [facility] for lymphadenopathy. Sandra Nicholson returned my call to schedule a new pt appt w/Dr. Candise Che on 9/9 at 240pm. Pt aware to arrive 30 minutes early.

## 2020-03-05 ENCOUNTER — Ambulatory Visit: Payer: Private Health Insurance - Indemnity | Attending: Nurse Practitioner | Admitting: Nurse Practitioner

## 2020-03-05 ENCOUNTER — Encounter: Payer: Self-pay | Admitting: Nurse Practitioner

## 2020-03-05 VITALS — Ht 67.0 in | Wt 158.0 lb

## 2020-03-05 DIAGNOSIS — R591 Generalized enlarged lymph nodes: Secondary | ICD-10-CM | POA: Diagnosis not present

## 2020-03-05 MED ORDER — PREDNISONE 20 MG PO TABS: 20.0000 mg | ORAL_TABLET | Freq: Every day | ORAL | 0 refills | Status: AC

## 2020-03-05 MED ORDER — CHLORHEXIDINE GLUCONATE 0.12 % MT SOLN: 15.0000 mL | Freq: Four times a day (QID) | OROMUCOSAL | 1 refills | Status: DC

## 2020-03-05 NOTE — Progress Notes (Signed)
Virtual Visit via Telephone Note Due to national recommendations of social distancing due to COVID 19, telehealth visit is felt to be most appropriate for this patient at this time.  I discussed the limitations, risks, security and privacy concerns of performing an evaluation and management service by telephone and the availability of in person appointments. I also discussed with the patient that there may be a patient responsible charge related to this service. The patient expressed understanding and agreed to proceed.    I connected with Sandra Nicholson on 03/05/20  at   3:30 PM EDT  EDT by telephone and verified that I am speaking with the correct person using two identifiers.   Consent I discussed the limitations, risks, security and privacy concerns of performing an evaluation and management service by telephone and the availability of in person appointments. I also discussed with the patient that there may be a patient responsible charge related to this service. The patient expressed understanding and agreed to proceed.   Location of Patient: Private Residence    Location of Provider: [facility]    Persons participating in Telemedicine visit: Bertram Denver FNP-BC YY Bien CMA Alcario Drought D Fessel    History of Present Illness: Telemedicine visit for: Painless Lymphadenopathy  Requesting antibiotic for occipital and right sided anterior cervical lymphadenopathy. She is being referred to oncology at this time due to recent US findings and for possible biopsy.  She has a history of recurrent pharyngitis. Has been treated recently with zpak, peridex for tonsillith,  Previous tests: COVID, strep, mono, CMV all negative. There is no leukocytosis. She has a remote history of smoking for a few years from the age of 55 until her early 45s on and off.  She states her lymphadenopathy was initially painful after having some dental work performed a few months ago. She  was told by the dentist that the pain would subside after about a month. She states the pain subsided however the lymphadenopathy did not.      Past Medical History:  Diagnosis Date  . Allergy     Past Surgical History:  Procedure Laterality Date  . NO PAST SURGERIES      Family History  Problem Relation Age of Onset  . Diabetes Father   . Hypertension Neg Hx     Social History   Socioeconomic History  . Marital status: Single    Spouse name: Not on file  . Number of children: Not on file  . Years of education: Not on file  . Highest education level: Not on file  Occupational History  . Not on file  Tobacco Use  . Smoking status: Former Games developer  . Smokeless tobacco: Never Used  Substance and Sexual Activity  . Alcohol use: Yes    Alcohol/week: 0.0 standard drinks    Comment: occ  . Drug use: No  . Sexual activity: Not on file  Other Topics Concern  . Not on file  Social History Narrative  . Not on file   Social Determinants of Health   Financial Resource Strain:   . Difficulty of Paying Living Expenses: Not on file  Food Insecurity:   . Worried About Programme researcher, broadcasting/film/video in the Last Year: Not on file  . Ran Out of Food in the Last Year: Not on file  Transportation Needs:   . Lack of Transportation (Medical): Not on file  . Lack of Transportation (Non-Medical): Not on file  Physical Activity:   .  Days of Exercise per Week: Not on file  . Minutes of Exercise per Session: Not on file  Stress:   . Feeling of Stress : Not on file  Social Connections:   . Frequency of Communication with Friends and Family: Not on file  . Frequency of Social Gatherings with Friends and Family: Not on file  . Attends Religious Services: Not on file  . Active Member of Clubs or Organizations: Not on file  . Attends Banker Meetings: Not on file  . Marital Status: Not on file     Observations/Objective: Awake, alert and oriented x 3   Review of Systems   Constitutional: Negative for fever, malaise/fatigue and weight loss.  HENT: Negative for nosebleeds.        SEE HPI  Eyes: Negative.  Negative for blurred vision, double vision and photophobia.  Respiratory: Negative.  Negative for cough and shortness of breath.   Cardiovascular: Negative.  Negative for chest pain, palpitations and leg swelling.  Gastrointestinal: Negative.  Negative for heartburn, nausea and vomiting.  Musculoskeletal: Negative.  Negative for myalgias.  Neurological: Negative.  Negative for dizziness, focal weakness, seizures and headaches.  Psychiatric/Behavioral: Negative.  Negative for suicidal ideas.    Assessment and Plan: Sandra Nicholson was seen today for establish care.  Diagnoses and all orders for this visit:  Lymphadenopathy of head and neck -     predniSONE (DELTASONE) 20 MG tablet; Take 1 tablet (20 mg total) by mouth daily with breakfast for 10 days. -     chlorhexidine (PERIDEX) 0.12 % solution; Use as directed 15 mLs in the mouth or throat 4 (four) times daily.     Follow Up Instructions Return if symptoms worsen or fail to improve.     I discussed the assessment and treatment plan with the patient. The patient was provided an opportunity to ask questions and all were answered. The patient agreed with the plan and demonstrated an understanding of the instructions.   The patient was advised to call back or seek an in-person evaluation if the symptoms worsen or if the condition fails to improve as anticipated.  I provided 16 minutes of non-face-to-face time during this encounter including median intraservice time, reviewing previous notes, labs, imaging, medications and explaining diagnosis and management.  Claiborne Rigg, FNP-BC

## 2020-03-14 ENCOUNTER — Telehealth: Payer: Self-pay | Admitting: Nurse Practitioner

## 2020-03-14 ENCOUNTER — Other Ambulatory Visit: Payer: Self-pay | Admitting: Nurse Practitioner

## 2020-03-14 DIAGNOSIS — R591 Generalized enlarged lymph nodes: Secondary | ICD-10-CM

## 2020-03-14 HISTORY — PX: LYMPH NODE BIOPSY: SHX201

## 2020-03-14 NOTE — Telephone Encounter (Signed)
Requested medication (s) are due for refill today: no  Requested medication (s) are on the active medication list: yes   Last refill:  03/05/2020  Future visit scheduled: Yes   Notes to clinic:  this refill cannot be delegated    Requested Prescriptions  Pending Prescriptions Disp Refills   predniSONE (DELTASONE) 20 MG tablet 10 tablet 0    Sig: Take 1 tablet (20 mg total) by mouth daily with breakfast for 10 days.      Not Delegated - Endocrinology:  Oral Corticosteroids Failed - 03/14/2020 12:09 PM      Failed - This refill cannot be delegated      Passed - Last BP in normal range    BP Readings from Last 1 Encounters:  02/15/20 108/76          Passed - Valid encounter within last 6 months    Recent Outpatient Visits           1 week ago Lymphadenopathy of head and neck   [facility] [place], Shea Stakes, NP   4 weeks ago Lymphadenopathy of head and neck   [facility] [place], [place], [place]   4 years ago Abdominal pain, epigastric   [facility], MD       Future Appointments             In 1 month Claiborne Rigg, NP [facility]

## 2020-03-14 NOTE — Telephone Encounter (Signed)
Copied from CRM [id number]. Topic: Quick Communication - Rx Refill/Question >> Mar 14, 2020 11:57 AM Marylen Ponto wrote: Medication: predniSONE (DELTASONE) 20 MG tablet  Has the patient contacted their pharmacy? no  Preferred Pharmacy (with phone number or street name): [id number] - [place] ([place]), [place] - [address] Phone: [phone]  Fax: [phone]  Agent: Please be advised that RX refills may take up to 3 business days. We ask that you follow-up with your pharmacy.

## 2020-03-15 NOTE — Telephone Encounter (Signed)
Prednisone denied. Needs to follow up with Oncology next week. They need to evaluate her off of any steroids

## 2020-03-15 NOTE — Telephone Encounter (Signed)
Pt called about her denied refill request for Prednisone / Pt states she was prescribed this after her recent appt and it has been helping but issue hasnt cleared up completely / Pt is asking for a refill for Prednisone to help/ Pt has an appt scheduled for October 1st with Zelda/ please advise if this can be filled and if so can it be done today and sent to [facility]

## 2020-03-15 NOTE — Telephone Encounter (Signed)
Routed to nurse.

## 2020-03-16 ENCOUNTER — Telehealth: Payer: Self-pay | Admitting: Nurse Practitioner

## 2020-03-16 NOTE — Telephone Encounter (Signed)
If patient call back please inform Prednisone denied. Needs to follow up with Oncology next week. They need to evaluate her off of any steroids.  Attempt to reach patient. No answer and unable to LVM.

## 2020-03-16 NOTE — Telephone Encounter (Signed)
Copied from CRM [id number]. Topic: General - Other >> Mar 15, 2020  4:47 PM Dalphine Handing A wrote: Patient would like a callback from nurse in regards to getting her prednisone prescription refilled. Patints request was denied due to needing an appt but patient stated that she already has an appt booked. Please advise

## 2020-03-16 NOTE — Telephone Encounter (Signed)
Attempt to reach patient to inform on PCP advising. No answer and Unable to LVM (Phone kept ringing)

## 2020-03-21 ENCOUNTER — Ambulatory Visit: Payer: 59 | Admitting: Physician Assistant

## 2020-03-22 ENCOUNTER — Inpatient Hospital Stay: Payer: Private Health Insurance - Indemnity

## 2020-03-22 ENCOUNTER — Telehealth: Payer: Self-pay | Admitting: Hematology

## 2020-03-22 ENCOUNTER — Other Ambulatory Visit: Payer: Self-pay

## 2020-03-22 ENCOUNTER — Inpatient Hospital Stay: Payer: Private Health Insurance - Indemnity | Attending: Hematology | Admitting: Hematology

## 2020-03-22 VITALS — BP 109/69 | HR 88 | Temp 98.9°F | Resp 18 | Ht 67.0 in | Wt 158.2 lb

## 2020-03-22 DIAGNOSIS — Z833 Family history of diabetes mellitus: Secondary | ICD-10-CM | POA: Insufficient documentation

## 2020-03-22 DIAGNOSIS — R591 Generalized enlarged lymph nodes: Secondary | ICD-10-CM | POA: Diagnosis not present

## 2020-03-22 DIAGNOSIS — Z87891 Personal history of nicotine dependence: Secondary | ICD-10-CM | POA: Insufficient documentation

## 2020-03-22 DIAGNOSIS — R590 Localized enlarged lymph nodes: Secondary | ICD-10-CM

## 2020-03-22 LAB — CBC WITH DIFFERENTIAL/PLATELET
Abs Immature Granulocytes: 0.01 K/uL (ref 0.00–0.07)
Basophils Absolute: 0 K/uL (ref 0.0–0.1)
Basophils Relative: 0 %
Eosinophils Absolute: 0.1 K/uL (ref 0.0–0.5)
Eosinophils Relative: 3 %
HCT: 35.6 % — ABNORMAL LOW (ref 36.0–46.0)
Hemoglobin: 11 g/dL — ABNORMAL LOW (ref 12.0–15.0)
Immature Granulocytes: 0 %
Lymphocytes Relative: 27 %
Lymphs Abs: 1.3 K/uL (ref 0.7–4.0)
MCH: 23.3 pg — ABNORMAL LOW (ref 26.0–34.0)
MCHC: 30.9 g/dL (ref 30.0–36.0)
MCV: 75.4 fL — ABNORMAL LOW (ref 80.0–100.0)
Monocytes Absolute: 0.8 K/uL (ref 0.1–1.0)
Monocytes Relative: 16 %
Neutro Abs: 2.6 K/uL (ref 1.7–7.7)
Neutrophils Relative %: 54 %
Platelets: 389 K/uL (ref 150–400)
RBC: 4.72 MIL/uL (ref 3.87–5.11)
RDW: 16.9 % — ABNORMAL HIGH (ref 11.5–15.5)
WBC: 4.8 K/uL (ref 4.0–10.5)
nRBC: 0 % (ref 0.0–0.2)

## 2020-03-22 LAB — CMP (CANCER CENTER ONLY)
ALT: 20 U/L (ref 0–44)
AST: 29 U/L (ref 15–41)
Albumin: 3.8 g/dL (ref 3.5–5.0)
Alkaline Phosphatase: 104 U/L (ref 38–126)
Anion gap: 7 (ref 5–15)
BUN: 9 mg/dL (ref 6–20)
CO2: 25 mmol/L (ref 22–32)
Calcium: 9.5 mg/dL (ref 8.9–10.3)
Chloride: 105 mmol/L (ref 98–111)
Creatinine: 0.83 mg/dL (ref 0.44–1.00)
GFR, Est AFR Am: >60 mL/min
GFR, Estimated: >60 mL/min
Glucose, Bld: 86 mg/dL (ref 70–99)
Potassium: 4.2 mmol/L (ref 3.5–5.1)
Sodium: 137 mmol/L (ref 135–145)
Total Bilirubin: 0.2 mg/dL — ABNORMAL LOW (ref 0.3–1.2)
Total Protein: 8.7 g/dL — ABNORMAL HIGH (ref 6.5–8.1)

## 2020-03-22 LAB — HEPATITIS B CORE ANTIBODY, TOTAL: Hep B Core Total Ab: NONREACTIVE

## 2020-03-22 LAB — HEPATITIS C ANTIBODY: HCV Ab: NONREACTIVE

## 2020-03-22 LAB — HEPATITIS B SURFACE ANTIGEN: Hepatitis B Surface Ag: NONREACTIVE

## 2020-03-22 LAB — SEDIMENTATION RATE: Sed Rate: 28 mm/h — ABNORMAL HIGH (ref 0–22)

## 2020-03-22 LAB — HIV ANTIBODY (ROUTINE TESTING W REFLEX): HIV Screen 4th Generation wRfx: NONREACTIVE

## 2020-03-22 LAB — HCG, SERUM, QUALITATIVE: Preg, Serum: NEGATIVE

## 2020-03-22 NOTE — Telephone Encounter (Signed)
SCHEDULED PER LOS. GAVE AVS AND CALENDAR  

## 2020-03-22 NOTE — Progress Notes (Signed)
HEMATOLOGY/ONCOLOGY CONSULTATION NOTE  Date of Service: 03/23/2020  Patient Care Team: Claiborne Rigg, NP as PCP - General (Nurse Practitioner)  CHIEF COMPLAINTS/PURPOSE OF CONSULTATION:  Lymphadenopathy  HISTORY OF PRESENTING ILLNESS:   Sandra Nicholson is a wonderful 34 y.o. 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 The pt reports that she is doing well overall.   The pt reports that she has been healthy overall and denies a significant medical history. Pt had a tooth removed in March and received antibiotics. She had a second tooth removed in April and did not receive antibiotics after. In April she began to notice lumps in the left side and back of her neck. She then found an enlarged lymph node on the right side of her neck. These lymph nodes were stable in size and appearance for a time. In June pt began having a sore throat and was told that she had tonsil stones and lymphadenopathy. She was given Peridex and Zithromax. Her lymph nodes decreased in size somewhat after the antibiotics. Shortly after this pt had an allergic reaction to food, that caused sore throat again. In August she was given 20 mg Prednisone for 10 days by her PCP to improve her lymphadenopathy. Pt noticed the largest reduction in the size of her lymph nodes after steroids. Her throat soreness continues to return occasionally.   Between July and August pt had several skin nodules appear. She believes that these were caused by insect bites. She was given topical cream, which shrunk these nodules marginally. Pt continues using Hydrocortizone on these areas.   Pt is allergic to macadamia nuts and certain fish - she is not sure what kind. She has no family history of cancers or blood disorders. She does not come into contact with chemicals or animals in her work directly, but works outside quite a bit. Pt denies previous mononucleosis infection, previous  blood transfusions, unsafe sexual habits, or exposure to bodily fluids.  Of note since the patient's last visit, pt has had US Soft Tissue Head & Neck ([id number]) completed on 02/24/2020 with results revealing "IMPRESSION: Pathologic lymphadenopathy of the head/neck, and lymphoproliferative disorder/lymphoma or other malignancy not excluded."  Most recent lab results (02/15/2020) of CBC w/diff is as follows: all values are WNL except for Hgb at 10.8, MCV at 76, MCH at 24.1, RDW at 16.1.  On review of systems, pt reports lumps/bumps, occasional throat soreness, skin nodules and denies fevers, chills, night sweats, itching, low appetite, unexpected weight loss, bowel habit changes, urinary habit changes, breast lumps, SOB, vocal changes, dysphagia and any other symptoms.   On PMHx the pt reports Wisdom tooth extraction, Allergy. On Social Hx the pt reports that she is a non-smoker and does not use vapes or chew tobacco often. Pt denies heavy alcohol use.   MEDICAL HISTORY:  Past Medical History:  Diagnosis Date  . Allergy     SURGICAL HISTORY: Past Surgical History:  Procedure Laterality Date  . NO PAST SURGERIES      SOCIAL HISTORY: Social History   Socioeconomic History  . Marital status: Single    Spouse name: Not on file  . Number of children: Not on file  . Years of education: Not on file  . Highest education level: Not on file  Occupational History  . Not on file  Tobacco Use  . Smoking status: Former Games developer  . Smokeless tobacco: Never Used  Substance and Sexual  Activity  . Alcohol use: Yes    Alcohol/week: 0.0 standard drinks    Comment: occ  . Drug use: No  . Sexual activity: Not on file  Other Topics Concern  . Not on file  Social History Narrative  . Not on file   Social Determinants of Health   Financial Resource Strain:   . Difficulty of Paying Living Expenses: Not on file  Food Insecurity:   . Worried About Programme researcher, broadcasting/film/video in the Last Year: Not on  file  . Ran Out of Food in the Last Year: Not on file  Transportation Needs:   . Lack of Transportation (Medical): Not on file  . Lack of Transportation (Non-Medical): Not on file  Physical Activity:   . Days of Exercise per Week: Not on file  . Minutes of Exercise per Session: Not on file  Stress:   . Feeling of Stress : Not on file  Social Connections:   . Frequency of Communication with Friends and Family: Not on file  . Frequency of Social Gatherings with Friends and Family: Not on file  . Attends Religious Services: Not on file  . Active Member of Clubs or Organizations: Not on file  . Attends Banker Meetings: Not on file  . Marital Status: Not on file  Intimate Partner Violence:   . Fear of Current or Ex-Partner: Not on file  . Emotionally Abused: Not on file  . Physically Abused: Not on file  . Sexually Abused: Not on file    FAMILY HISTORY: Family History  Problem Relation Age of Onset  . Diabetes Father   . Hypertension Neg Hx     ALLERGIES:  is allergic to fish allergy and macadamia nut oil.  MEDICATIONS:  Current Outpatient Medications  Medication Sig Dispense Refill  . acetaminophen (TYLENOL) 500 MG tablet Take 1,000 mg by mouth every 6 (six) hours as needed for headache (pain).    . Aspirin-Acetaminophen-Caffeine (GOODY HEADACHE PO) Take 1 packet by mouth 2 (two) times daily as needed (pain).    Marland Kitchen azithromycin (ZITHROMAX Z-PAK) 250 MG tablet Take 1 tablet (250 mg total) by mouth daily. 500mg  PO day 1, then 250mg  PO days 205 6 tablet 0  . chlorhexidine (PERIDEX) 0.12 % solution Use as directed 15 mLs in the mouth or throat 4 (four) times daily. 240 mL 1  . Multiple Vitamin (MULTIVITAMIN WITH MINERALS) TABS tablet Take 1 tablet by mouth daily.     No current facility-administered medications for this visit.    REVIEW OF SYSTEMS:    10 Point review of Systems was done is negative except as noted above.  PHYSICAL EXAMINATION: ECOG PERFORMANCE  STATUS: 1 - Symptomatic but completely ambulatory  . Vitals:   03/22/20 1454  BP: 109/69  Pulse: 88  Resp: 18  Temp: 98.9 F (37.2 C)  SpO2: 100%   Filed Weights   03/22/20 1454  Weight: 158 lb 3.2 oz (71.8 kg)   .Body mass index is 24.78 kg/m.  GENERAL:alert, in no acute distress and comfortable SKIN: no acute rashes, several papular skin lesions on left upper arm.  EYES: conjunctiva are pink and non-injected, sclera anicteric OROPHARYNX: MMM, no exudates, no oropharyngeal erythema or ulceration NECK: supple, no JVD LYMPH:  no palpable lymphadenopathy in the axillary or inguinal regions. Significant lymphadenopathy in the occipital, left supraclavicular, & b/l cervical regions. LUNGS: clear to auscultation b/l with normal respiratory effort HEART: regular rate & rhythm ABDOMEN:  normoactive bowel sounds ,  non tender, not distended. Extremity: no pedal edema PSYCH: alert & oriented x 3 with fluent speech NEURO: no focal motor/sensory deficits  LABORATORY DATA:  I have reviewed the data as listed  . CBC Latest Ref Rng & Units 03/22/2020 02/15/2020 10/23/2019  WBC 4.0 - 10.5 K/uL 4.8 4.5 4.7  Hemoglobin 12.0 - 15.0 g/dL 11.0(L) 10.8(L) 12.1  Hematocrit 36 - 46 % 35.6(L) 34.2 39.4  Platelets 150 - 400 K/uL 389 400 361    . CMP Latest Ref Rng & Units 03/22/2020 10/23/2019 03/02/2017  Glucose 70 - 99 mg/dL 86 92 656(C)  BUN 6 - 20 mg/dL 9 10 5(L)  Creatinine 1.27 - 1.00 mg/dL 5.17 0.01 7.49(S)  Sodium 135 - 145 mmol/L 137 137 138  Potassium 3.5 - 5.1 mmol/L 4.2 3.8 3.6  Chloride 98 - 111 mmol/L 105 103 105  CO2 22 - 32 mmol/L 25 24 26   Calcium 8.9 - 10.3 mg/dL 9.5 9.3 9.2  Total Protein 6.5 - 8.1 g/dL ) 7.7 7.1  Total Bilirubin 0.3 - 1.2 mg/dL 4.9(Q) 7.5(F) 1.6(B)  Alkaline Phos 38 - 126 U/L 104 114 47  AST 15 - 41 U/L 29 39 23  ALT 0 - 44 U/L 20 30 15      RADIOGRAPHIC STUDIES: I have personally reviewed the radiological images as listed and agreed with the  findings in the report. 8.4(Y SOFT TISSUE HEAD & NECK (NON-THYROID)  Result Date: 02/25/2020 CLINICAL DATA:  34 year old female with a history of lymphadenopathy EXAM: ULTRASOUND OF HEAD/NECK SOFT TISSUES TECHNIQUE: Ultrasound examination of the head and neck soft tissues was performed in the area of clinical concern. COMPARISON:  None. FINDINGS: Grayscale and color duplex performed in the region clinical concern. Several enlarged pathologic appearing lymph nodes are present. Largest measures short axis 1.3 cm. No focal fluid. IMPRESSION: Pathologic lymphadenopathy of the head/neck, and lymphoproliferative disorder/lymphoma or other malignancy not excluded. Electronically Signed   By: 02/27/2020 D.O.   On: 02/25/2020 13:35    ASSESSMENT & PLAN:   34 yo with   1_ Progressive b/l cervical lymphadenopathy concerning for lymphoma PLAN: -Discussed patient's most recent labs from 02/15/2020, anemia & microcytosis. -Discussed 20 Soft Tissue Head & Neck (04/16/2020) completed on 02/24/2020 with results revealing "IMPRESSION: Pathologic lymphadenopathy of the head/neck, and lymphoproliferative disorder/lymphoma or other malignancy not excluded." -Advised pt that steroids can shrink lymphadenopathy irrespective of the cause, this makes it more difficult to test and get a definitive Dx.  -Advised pt that imaging can tell [phone] if her cervical lymphadenopathy is a part of a larger process.  -Advised pt that a passing viral infection could cause the diffuse lymphadenopathy we've observed, but is not typically this persistent.  -Discussed Needle Biopsy vs Surgical Biopsy  -Will get 02/26/2020 Guided Needle Biopsy ASAP -Will get PET/CT in 5 days -Will labs today -Will see back in 2 weeks    FOLLOW UP: Labs today PET/CT in 5 days US guided core needle biopsy of cervical LN ASAP RTC with Dr [place] in 2 weeks   All of the patients questions were answered with apparent satisfaction. The patient knows to call the  clinic with any problems, questions or concerns.  I spent 40 mins counseling the patient face to face. The total time spent in the appointment was 60 minutes and more than 50% was on counseling and direct patient cares.    [place] MD MS AAHIVMS [facility] Hematology/Oncology Physician [facility]  (Office):  [phone] (Work cell):  [phone] (Fax):           [phone]  03/23/2020 4:01 PM  I, Carollee HerterJazzmine Knight, am acting as a scribe for Dr. Wyvonnia LoraGautam Jeremiah Tarpley.   .I have reviewed the above documentation for accuracy and completeness, and I agree with the above. Johney Maine.Javiana Anwar Kishore Emmalea Treanor MD

## 2020-03-23 ENCOUNTER — Encounter (HOSPITAL_COMMUNITY): Payer: Self-pay | Admitting: Radiology

## 2020-03-23 LAB — FERRITIN: Ferritin: 27 ng/mL (ref 11–307)

## 2020-03-23 LAB — IRON AND TIBC
Iron: 22 ug/dL — ABNORMAL LOW (ref 41–142)
Saturation Ratios: 6 % — ABNORMAL LOW (ref 21–57)
TIBC: 395 ug/dL (ref 236–444)
UIBC: 373 ug/dL (ref 120–384)

## 2020-03-23 LAB — LACTATE DEHYDROGENASE: LDH: 290 U/L — ABNORMAL HIGH (ref 98–192)

## 2020-03-23 NOTE — Progress Notes (Signed)
Sandra Nicholson. Shaheed Female, 34 y.o., 06-07-1986 MRN:  [id number] Phone:  [phone] Judie Petit) PCP:  Claiborne Rigg, NP Coverage:  Sallye Lat Next Appt With Radiology ([facility] PET) 03/30/2020 at 3:00 PM  RE: [place] Core Lymph Node Biopsy Received: Today Malachy Moan, MD  Henry Russel D Approved for US guided core biopsy of right neck lymphadenopathy, lymphoma work-up.   HKM       Previous Messages   ----- Message -----  From: Henry Russel D  Sent: 03/22/2020  4:29 PM EDT  To: [facility] Procedure Requests  Subject: [place] Core Lymph Node Biopsy             Procedure: [place] Core Lymph Node Biopsy   Reason: Cervical lymphadenopathy, patient with b/l cervical lymphadenopathy highly concerning for lymphoma for US guided core needle biopsy of rt cervical or left supraclavicular LN   History: [place] in computer, [facility] PET scheduled for 03/30/2020   Provider: Johney Maine   Provider Contact: [phone]

## 2020-03-29 ENCOUNTER — Other Ambulatory Visit: Payer: Self-pay | Admitting: Radiology

## 2020-03-30 ENCOUNTER — Ambulatory Visit (HOSPITAL_COMMUNITY): Payer: Private Health Insurance - Indemnity

## 2020-03-30 ENCOUNTER — Other Ambulatory Visit: Payer: Self-pay | Admitting: Student

## 2020-04-02 ENCOUNTER — Other Ambulatory Visit: Payer: Self-pay

## 2020-04-02 ENCOUNTER — Encounter (HOSPITAL_COMMUNITY): Payer: Self-pay

## 2020-04-02 ENCOUNTER — Ambulatory Visit (HOSPITAL_COMMUNITY)
Admission: RE | Admit: 2020-04-02 | Discharge: 2020-04-02 | Disposition: A | Discharge status: Home / Self Care | Payer: Private Health Insurance - Indemnity | Source: Ambulatory Visit | Attending: Hematology | Admitting: Hematology

## 2020-04-02 DIAGNOSIS — Z7982 Long term (current) use of aspirin: Secondary | ICD-10-CM | POA: Insufficient documentation

## 2020-04-02 DIAGNOSIS — R590 Localized enlarged lymph nodes: Secondary | ICD-10-CM

## 2020-04-02 DIAGNOSIS — I889 Nonspecific lymphadenitis, unspecified: Secondary | ICD-10-CM | POA: Insufficient documentation

## 2020-04-02 DIAGNOSIS — J029 Acute pharyngitis, unspecified: Secondary | ICD-10-CM | POA: Diagnosis present

## 2020-04-02 DIAGNOSIS — Z79899 Other long term (current) drug therapy: Secondary | ICD-10-CM | POA: Diagnosis not present

## 2020-04-02 DIAGNOSIS — Z87891 Personal history of nicotine dependence: Secondary | ICD-10-CM | POA: Diagnosis not present

## 2020-04-02 LAB — CBC
HCT: 38.6 % (ref 36.0–46.0)
Hemoglobin: 11.3 g/dL — ABNORMAL LOW (ref 12.0–15.0)
MCH: 22.2 pg — ABNORMAL LOW (ref 26.0–34.0)
MCHC: 29.3 g/dL — ABNORMAL LOW (ref 30.0–36.0)
MCV: 75.7 fL — ABNORMAL LOW (ref 80.0–100.0)
Platelets: 415 10*3/uL — ABNORMAL HIGH (ref 150–400)
RBC: 5.1 MIL/uL (ref 3.87–5.11)
RDW: 16.7 % — ABNORMAL HIGH (ref 11.5–15.5)
WBC: 3.9 10*3/uL — ABNORMAL LOW (ref 4.0–10.5)
nRBC: 0 % (ref 0.0–0.2)

## 2020-04-02 LAB — PROTIME-INR
INR: 1 (ref 0.8–1.2)
Prothrombin Time: 13 seconds (ref 11.4–15.2)

## 2020-04-02 MED ORDER — SODIUM CHLORIDE 0.9 % IV SOLN: INTRAVENOUS | Status: DC

## 2020-04-02 MED ORDER — MIDAZOLAM HCL 2 MG/2ML IJ SOLN
INTRAMUSCULAR | Status: AC | PRN
  Administered 2020-04-02: 1 mg via INTRAVENOUS

## 2020-04-02 MED ORDER — FENTANYL CITRATE (PF) 100 MCG/2ML IJ SOLN
INTRAMUSCULAR | Status: AC
  Filled 2020-04-02: qty 2

## 2020-04-02 MED ORDER — LIDOCAINE HCL (PF) 1 % IJ SOLN
INTRAMUSCULAR | Status: AC
  Administered 2020-04-02: 10 mL
  Filled 2020-04-02: qty 30

## 2020-04-02 MED ORDER — FENTANYL CITRATE (PF) 100 MCG/2ML IJ SOLN
INTRAMUSCULAR | Status: AC | PRN
  Administered 2020-04-02: 50 ug via INTRAVENOUS

## 2020-04-02 MED ORDER — MIDAZOLAM HCL 2 MG/2ML IJ SOLN
INTRAMUSCULAR | Status: AC
  Filled 2020-04-02: qty 2

## 2020-04-02 NOTE — Discharge Instructions (Addendum)
Needle Biopsy, Care After These instructions tell you how to care for yourself after your procedure. Your doctor may also give you more specific instructions. Call your doctor if you have any problems or questions. What can I expect after the procedure? After the procedure, it is common to have:  Soreness.  Bruising.  Mild pain. Follow these instructions at home:   Return to your normal activities as told by your doctor. Ask your doctor what activities are safe for you.  Take over-the-counter and prescription medicines only as told by your doctor.  Wash your hands with soap and water before you change your bandage (dressing). If you cannot use soap and water, use hand sanitizer.  Follow instructions from your doctor about: ? How to take care of your puncture site. ? When and how to change your bandage. ? When to remove your bandage.  Check your puncture site every day for signs of infection. Watch for: ? Redness, swelling, or pain. ? Fluid or blood. ? Pus or a bad smell. ? Warmth.  Do not take baths, swim, or use a hot tub until your doctor approves. Ask your doctor if you may take showers. You may only be allowed to take sponge baths.  Keep all follow-up visits as told by your doctor. This is important. Contact a doctor if you have:  A fever.  Redness, swelling, or pain at the puncture site, and it lasts longer than a few days.  Fluid, blood, or pus coming from the puncture site.  Warmth coming from the puncture site. Get help right away if:  You have a lot of bleeding from the puncture site. Summary  After the procedure, it is common to have soreness, bruising, or mild pain at the puncture site.  Check your puncture site every day for signs of infection, such as redness, swelling, or pain.  Get help right away if you have severe bleeding from your puncture site. This information is not intended to replace advice given to you by your health care provider. Make  sure you discuss any questions you have with your health care provider. Document Revised: 07/13/2017 Document Reviewed: 07/13/2017 Elsevier Patient Education  2020 Elsevier Inc. Moderate Conscious Sedation, Adult Sedation is the use of medicines to promote relaxation and relieve discomfort and anxiety. Moderate conscious sedation is a type of sedation. Under moderate conscious sedation, you are less alert than normal, but you are still able to respond to instructions, touch, or both. Moderate conscious sedation is used during short medical and dental procedures. It is milder than deep sedation, which is a type of sedation under which you cannot be easily woken up. It is also milder than general anesthesia, which is the use of medicines to make you unconscious. Moderate conscious sedation allows you to return to your regular activities sooner. Tell a health care provider about:  Any allergies you have.  All medicines you are taking, including vitamins, herbs, eye drops, creams, and over-the-counter medicines.  Use of steroids (by mouth or creams).  Any problems you or family members have had with sedatives and anesthetic medicines.  Any blood disorders you have.  Any surgeries you have had.  Any medical conditions you have, such as sleep apnea.  Whether you are pregnant or may be pregnant.  Any use of cigarettes, alcohol, marijuana, or street drugs. What are the risks? Generally, this is a safe procedure. However, problems may occur, including:  Getting too much medicine (oversedation).  Nausea.  Allergic reaction to   medicines.  Trouble breathing. If this happens, a breathing tube may be used to help with breathing. It will be removed when you are awake and breathing on your own.  Heart trouble.  Lung trouble. What happens before the procedure? Staying hydrated Follow instructions from your health care provider about hydration, which may include:  Up to 2 hours before the  procedure - you may continue to drink clear liquids, such as water, clear fruit juice, black coffee, and plain tea. Eating and drinking restrictions Follow instructions from your health care provider about eating and drinking, which may include:  8 hours before the procedure - stop eating heavy meals or foods such as meat, fried foods, or fatty foods.  6 hours before the procedure - stop eating light meals or foods, such as toast or cereal.  6 hours before the procedure - stop drinking milk or drinks that contain milk.  2 hours before the procedure - stop drinking clear liquids. Medicine Ask your health care provider about:  Changing or stopping your regular medicines. This is especially important if you are taking diabetes medicines or blood thinners.  Taking medicines such as aspirin and ibuprofen. These medicines can thin your blood. Do not take these medicines before your procedure if your health care provider instructs you not to.  Tests and exams  You will have a physical exam.  You may have blood tests done to show: ? How well your kidneys and liver are working. ? How well your blood can clot. General instructions  Plan to have someone take you home from the hospital or clinic.  If you will be going home right after the procedure, plan to have someone with you for 24 hours. What happens during the procedure?  An IV tube will be inserted into one of your veins.  Medicine to help you relax (sedative) will be given through the IV tube.  The medical or dental procedure will be performed. What happens after the procedure?  Your blood pressure, heart rate, breathing rate, and blood oxygen level will be monitored often until the medicines you were given have worn off.  Do not drive for 24 hours. This information is not intended to replace advice given to you by your health care provider. Make sure you discuss any questions you have with your health care provider. Document  Revised: 06/12/2017 Document Reviewed: 10/20/2015 Elsevier Patient Education  2020 Elsevier Inc.  

## 2020-04-02 NOTE — H&P (Signed)
Chief Complaint: Patient was seen in consultation today for right neck lymphadenopathy biopsy at the request of Johney Maine  Referring Physician(s): Johney Maine  Supervising Physician: Irish Lack  Patient Status: [facility] - Out-pt  History of Present Illness: Sandra Nicholson is a 34 y.o. female   All started in March and April 2021 after teeth removals Developed swollen neck LNs Antibiotics after March removal-- none after April removal Developed enlarged neck nodes soon after By June no change in nodes but sore throat ---antibiotics caused LAN to diminish--- steroids also helped a lot Has been negative for Covid; Mono. Not sexually active Denies fever, chills  [place] Head/neck 02/24/20: IMPRESSION: Pathologic lymphadenopathy of the head/neck, and lymphoproliferative disorder/lymphoma or other malignancy not excluded.  Referred to Dr Candise Che Scheduled now for Rt neck LAN biopsy    Past Medical History:  Diagnosis Date  . Allergy     Past Surgical History:  Procedure Laterality Date  . NO PAST SURGERIES      Allergies: Fish allergy and Macadamia nut oil  Medications: Prior to Admission medications   Medication Sig Start Date End Date Taking? Authorizing Provider  acetaminophen (TYLENOL) 500 MG tablet Take 1,000 mg by mouth every 6 (six) hours as needed for headache (pain).   Yes [provider]  Aspirin-Acetaminophen-Caffeine (GOODY HEADACHE PO) Take 1 packet by mouth 2 (two) times daily as needed (pain).   Yes [provider]  Multiple Vitamin (MULTIVITAMIN WITH MINERALS) TABS tablet Take 1 tablet by mouth daily.   Yes [provider]  chlorhexidine (PERIDEX) 0.12 % solution Use as directed 15 mLs in the mouth or throat 4 (four) times daily. Patient not taking: Reported on 03/27/2020 03/05/20   Claiborne Rigg, NP     Family History  Problem Relation Age of Onset  . Diabetes Father   . Hypertension Neg Hx     Social  History   Socioeconomic History  . Marital status: Single    Spouse name: Not on file  . Number of children: Not on file  . Years of education: Not on file  . Highest education level: Not on file  Occupational History  . Not on file  Tobacco Use  . Smoking status: Former Games developer  . Smokeless tobacco: Never Used  Vaping Use  . Vaping Use: Never used  Substance and Sexual Activity  . Alcohol use: Yes    Alcohol/week: 0.0 standard drinks    Comment: occ  . Drug use: No  . Sexual activity: Not on file  Other Topics Concern  . Not on file  Social History Narrative  . Not on file   Social Determinants of Health   Financial Resource Strain:   . Difficulty of Paying Living Expenses: Not on file  Food Insecurity:   . Worried About Programme researcher, broadcasting/film/video in the Last Year: Not on file  . Ran Out of Food in the Last Year: Not on file  Transportation Needs:   . Lack of Transportation (Medical): Not on file  . Lack of Transportation (Non-Medical): Not on file  Physical Activity:   . Days of Exercise per Week: Not on file  . Minutes of Exercise per Session: Not on file  Stress:   . Feeling of Stress : Not on file  Social Connections:   . Frequency of Communication with Friends and Family: Not on file  . Frequency of Social Gatherings with Friends and Family: Not on file  . Attends Religious Services: Not  on file  . Active Member of Clubs or Organizations: Not on file  . Attends Banker Meetings: Not on file  . Marital Status: Not on file    Review of Systems: A 12 point ROS discussed and pertinent positives are indicated in the HPI above.  All other systems are negative.  Review of Systems  Constitutional: Negative for activity change, appetite change, chills, fatigue, fever and unexpected weight change.  HENT: Positive for sore throat. Negative for trouble swallowing.   Respiratory: Negative for cough and shortness of breath.   Cardiovascular: Negative for chest  pain.  Gastrointestinal: Negative for abdominal pain.  Musculoskeletal: Negative for neck stiffness.  Psychiatric/Behavioral: Negative for behavioral problems and confusion.    Vital Signs: BP 96/74   Pulse 62   Temp 98 F (36.7 C) (Oral)   Resp 16   Ht 5\' 7"  (1.702 m)   Wt 158 lb (71.7 kg)   LMP 03/31/2020   SpO2 100%   BMI 24.75 kg/m   Physical Exam Vitals reviewed.  HENT:     Mouth/Throat:     Mouth: Mucous membranes are moist.  Cardiovascular:     Rate and Rhythm: Normal rate and regular rhythm.     Heart sounds: Normal heart sounds.  Pulmonary:     Effort: Pulmonary effort is normal.     Breath sounds: Normal breath sounds.  Abdominal:     Palpations: Abdomen is soft.  Musculoskeletal:        General: Normal range of motion.     Cervical back: Normal range of motion.  Lymphadenopathy:     Cervical: Cervical adenopathy present.  Skin:    General: Skin is warm.  Neurological:     Mental Status: She is alert and oriented to person, place, and time.  Psychiatric:        Behavior: Behavior normal.     Imaging: No results found.  Labs:  CBC: Recent Labs    10/23/19 1725 02/15/20 1444 03/22/20 1614 04/02/20 0602  WBC 4.7 4.5 4.8 3.9*  HGB 12.1 10.8* 11.0* 11.3*  HCT 39.4 34.2 35.6* 38.6  PLT 361 400 389 415*    COAGS: Recent Labs    04/02/20 0602  INR 1.0    BMP: Recent Labs    10/23/19 1725 03/22/20 1614  NA 137 137  K 3.8 4.2  CL 103 105  CO2 24 25  GLUCOSE 92 86  BUN 10 9  CALCIUM 9.3 9.5  CREATININE 0.81 0.83  GFRNONAA >60 >60  GFRAA >60 >60    LIVER FUNCTION TESTS: Recent Labs    10/23/19 1725 03/22/20 1614  BILITOT 0.2* 0.2*  AST 39 29  ALT 30 20  ALKPHOS 114 104  PROT 7.7 8.7*  ALBUMIN 3.7 3.8    TUMOR MARKERS: No results for input(s): AFPTM, CEA, CA199, CHROMGRNA in the last 8760 hours.  Assessment and Plan:  Persistent Cervical lymphadenopathy Sore throat Scheduled now for Rt neck lymph node  biopsy Risks and benefits of Rt neck lymph node biopsy was discussed with the patient and/or patient's family including, but not limited to bleeding, infection, damage to adjacent structures or low yield requiring additional tests.  All of the questions were answered and there is agreement to proceed. Consent signed and in chart.   Thank you for this interesting consult.  I greatly enjoyed meeting Sandra Nicholson and look forward to participating in their care.  A copy of this report was sent to the requesting  provider on this date.  Electronically Signed: Robet Leu, PA-C 04/02/2020, 7:29 AM   I spent a total of  30 Minutes   in face to face in clinical consultation, greater than 50% of which was counseling/coordinating care for right neck LAN bx

## 2020-04-02 NOTE — Procedures (Signed)
Interventional Radiology Procedure Note  Procedure: US Guided Biopsy of right cervical lymph node  Complications: None  Estimated Blood Loss: < 10 mL  Findings: 16 G core biopsy of right cervical lymph node performed under US guidance.  Four core samples obtained and sent to Pathology.  Jodi Marble. Fredia Sorrow, M.D Pager:  [phone]

## 2020-04-04 ENCOUNTER — Telehealth: Payer: Self-pay | Admitting: Nurse Practitioner

## 2020-04-04 LAB — SURGICAL PATHOLOGY

## 2020-04-04 NOTE — Telephone Encounter (Signed)
Patient called to speak with the doctor regarding her oncology results.  CB# [id number]

## 2020-04-05 NOTE — Telephone Encounter (Signed)
Attempt to reach patient to inform she will need to call her oncologist office for the results. No answer and LVM.

## 2020-04-06 NOTE — Progress Notes (Signed)
HEMATOLOGY/ONCOLOGY CONSULTATION NOTE  Date of Service: 04/09/2020  Patient Care Team: Claiborne RiggFleming, Zelda W, NP as PCP - General (Nurse Practitioner)  CHIEF COMPLAINTS/PURPOSE OF CONSULTATION:  Lymphadenopathy  HISTORY OF PRESENTING ILLNESS:   Sandra Nicholson is a wonderful 34 y.o. 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 The pt reports that she is doing well overall.   The pt reports that she has been healthy overall and denies a significant medical history. Pt had a tooth removed in March and received antibiotics. She had a second tooth removed in April and did not receive antibiotics after. In April she began to notice lumps in the left side and back of her neck. She then found an enlarged lymph node on the right side of her neck. These lymph nodes were stable in size and appearance for a time. In June pt began having a sore throat and was told that she had tonsil stones and lymphadenopathy. She was given Peridex and Zithromax. Her lymph nodes decreased in size somewhat after the antibiotics. Shortly after this pt had an allergic reaction to food, that caused sore throat again. In August she was given 20 mg Prednisone for 10 days by her PCP to improve her lymphadenopathy. Pt noticed the largest reduction in the size of her lymph nodes after steroids. Her throat soreness continues to return occasionally.   Between July and August pt had several skin nodules appear. She believes that these were caused by insect bites. She was given topical cream, which shrunk these nodules marginally. Pt continues using Hydrocortizone on these areas.   Pt is allergic to macadamia nuts and certain fish - she is not sure what kind. She has no family history of cancers or blood disorders. She does not come into contact with chemicals or animals in her work directly, but works outside quite a bit. Pt denies previous mononucleosis infection, previous  blood transfusions, unsafe sexual habits, or exposure to bodily fluids.  Of note since the patient's last visit, pt has had US Soft Tissue Head & Neck ([id number]) completed on 02/24/2020 with results revealing "IMPRESSION: Pathologic lymphadenopathy of the head/neck, and lymphoproliferative disorder/lymphoma or other malignancy not excluded."  Most recent lab results (02/15/2020) of CBC w/diff is as follows: all values are WNL except for Hgb at 10.8, MCV at 76, MCH at 24.1, RDW at 16.1.  On review of systems, pt reports lumps/bumps, occasional throat soreness, skin nodules and denies fevers, chills, night sweats, itching, low appetite, unexpected weight loss, bowel habit changes, urinary habit changes, breast lumps, SOB, vocal changes, dysphagia and any other symptoms.   On PMHx the pt reports Wisdom tooth extraction, Allergy. On Social Hx the pt reports that she is a non-smoker and does not use vapes or chew tobacco often. Pt denies heavy alcohol use.   INTERVAL HISTORY: Sandra Nicholson is a wonderful 34 y.o. 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 The patient's last visit with us was on 03/22/2020. The pt reports that she is doing well overall.  The pt reports that she has not experienced any noticeable shortness of breath, chest pain, or other respiratory symptoms. She does not feel that her breathing has changed significantly over the last year. Pt has a history of tonsils stones. She has denies any lymphadenopathy prior to March of this year.   Pt has an appointment with a Dermatologist on 05/08/20.   Of note since the  patient's last visit, pt has had Right Cervical Lymph Node Needle Core Biopsy ([id number]) completed on 04/02/2020 with results revealing "- Non-caseating granulomatous lymphadenitis. - No malignancy identified."  Lab results (03/22/2020) of CBC w/diff and CMP is as follows: all values are WNL except for Hgb at 11.0, HCT at 35.6, MCV at 75.4, MCH  at 23.3., RDW at 16.9, Total Protein at 8.7, Total Bilirubin at 0.2. 03/22/2020 Iron Panel is as follows: Iron at 22, TIBC at 395, Sat Ratios at 6, UIBC at 373 03/22/2020 Hepatitis B Surface Ag is Non Reactive 03/22/2020 Hep B Core Total Ab is Non Reactive 03/22/2020 HIV Screen is Non Reactive 03/22/2020 HCV Ab is Non Reactive 03/22/2020 HCG serum is Negative  03/22/2020 Sed Rate at 28 03/22/2020 Ferritin at 27 03/22/2020 LDH at 290  On review of systems, pt denies SOB, angina, rash, fevers, chills, new fatigue, tingling/numbness in hands/feet, change in vision and any other symptoms.    MEDICAL HISTORY:  Past Medical History:  Diagnosis Date  . Allergy     SURGICAL HISTORY: Past Surgical History:  Procedure Laterality Date  . NO PAST SURGERIES      SOCIAL HISTORY: Social History   Socioeconomic History  . Marital status: Single    Spouse name: Not on file  . Number of children: Not on file  . Years of education: Not on file  . Highest education level: Not on file  Occupational History  . Not on file  Tobacco Use  . Smoking status: Former Games developer  . Smokeless tobacco: Never Used  Vaping Use  . Vaping Use: Never used  Substance and Sexual Activity  . Alcohol use: Yes    Alcohol/week: 0.0 standard drinks    Comment: occ  . Drug use: No  . Sexual activity: Not on file  Other Topics Concern  . Not on file  Social History Narrative  . Not on file   Social Determinants of Health   Financial Resource Strain:   . Difficulty of Paying Living Expenses: Not on file  Food Insecurity:   . Worried About Programme researcher, broadcasting/film/video in the Last Year: Not on file  . Ran Out of Food in the Last Year: Not on file  Transportation Needs:   . Lack of Transportation (Medical): Not on file  . Lack of Transportation (Non-Medical): Not on file  Physical Activity:   . Days of Exercise per Week: Not on file  . Minutes of Exercise per Session: Not on file  Stress:   . Feeling of Stress :  Not on file  Social Connections:   . Frequency of Communication with Friends and Family: Not on file  . Frequency of Social Gatherings with Friends and Family: Not on file  . Attends Religious Services: Not on file  . Active Member of Clubs or Organizations: Not on file  . Attends Banker Meetings: Not on file  . Marital Status: Not on file  Intimate Partner Violence:   . Fear of Current or Ex-Partner: Not on file  . Emotionally Abused: Not on file  . Physically Abused: Not on file  . Sexually Abused: Not on file    FAMILY HISTORY: Family History  Problem Relation Age of Onset  . Diabetes Father   . Hypertension Neg Hx     ALLERGIES:  is allergic to fish allergy and macadamia nut oil.  MEDICATIONS:  Current Outpatient Medications  Medication Sig Dispense Refill  . acetaminophen (TYLENOL) 500 MG tablet Take 1,000  mg by mouth every 6 (six) hours as needed for headache (pain).    . Aspirin-Acetaminophen-Caffeine (GOODY HEADACHE PO) Take 1 packet by mouth 2 (two) times daily as needed (pain).    . chlorhexidine (PERIDEX) 0.12 % solution Use as directed 15 mLs in the mouth or throat 4 (four) times daily. (Patient not taking: Reported on 03/27/2020) 240 mL 1  . iron polysaccharides (NIFEREX) 150 MG capsule Take 1 capsule (150 mg total) by mouth daily. 60 capsule 3  . Multiple Vitamin (MULTIVITAMIN WITH MINERALS) TABS tablet Take 1 tablet by mouth daily.     No current facility-administered medications for this visit.    REVIEW OF SYSTEMS:   A 10+ POINT REVIEW OF SYSTEMS WAS OBTAINED including neurology, dermatology, psychiatry, cardiac, respiratory, lymph, extremities, GI, GU, Musculoskeletal, constitutional, breasts, reproductive, HEENT.  All pertinent positives are noted in the HPI.  All others are negative.   PHYSICAL EXAMINATION: ECOG PERFORMANCE STATUS: 1 - Symptomatic but completely ambulatory  . Vitals:   04/09/20 0959  BP: 102/74  Pulse: 85  Resp: 18    Temp: 98.6 F (37 C)  SpO2: 100%   Filed Weights   04/09/20 0959  Weight: 158 lb 8 oz (71.9 kg)   .Body mass index is 24.82 kg/m.   GENERAL:alert, in no acute distress and comfortable SKIN: no acute rashes, no significant lesions EYES: conjunctiva are pink and non-injected, sclera anicteric OROPHARYNX: MMM, no exudates, no oropharyngeal erythema or ulceration NECK: supple, no JVD LYMPH:  no palpable lymphadenopathy in the axillary or inguinal regions. Significant lymphadenopathy in the occipital, left supraclavicular, & b/l cervical regions. LUNGS: clear to auscultation b/l with normal respiratory effort HEART: regular rate & rhythm ABDOMEN:  normoactive bowel sounds , non tender, not distended. No palpable hepatosplenomegaly.  Extremity: no pedal edema PSYCH: alert & oriented x 3 with fluent speech NEURO: no focal motor/sensory deficits  LABORATORY DATA:  I have reviewed the data as listed  . CBC Latest Ref Rng & Units 04/02/2020 03/22/2020 02/15/2020  WBC 4.0 - 10.5 K/uL 3.9(L) 4.8 4.5  Hemoglobin 12.0 - 15.0 g/dL 11.3(L) 11.0(L) 10.8(L)  Hematocrit 36 - 46 % 38.6 35.6(L) 34.2  Platelets 150 - 400 K/uL 415(H) 389 400    . CMP Latest Ref Rng & Units 03/22/2020 10/23/2019 03/02/2017  Glucose 70 - 99 mg/dL 86 92 355(D)  BUN 6 - 20 mg/dL 9 10 5(L)  Creatinine 3.22 - 1.00 mg/dL 0.25 4.27 0.62(B)  Sodium 135 - 145 mmol/L 137 137 138  Potassium 3.5 - 5.1 mmol/L 4.2 3.8 3.6  Chloride 98 - 111 mmol/L 105 103 105  CO2 22 - 32 mmol/L 25 24 26   Calcium 8.9 - 10.3 mg/dL 9.5 9.3 9.2  Total Protein 6.5 - 8.1 g/dL ) 7.7 7.1  Total Bilirubin 0.3 - 1.2 mg/dL 7.6(E) 8.3(T) 5.1(V)  Alkaline Phos 38 - 126 U/L 104 114 47  AST 15 - 41 U/L 29 39 23  ALT 0 - 44 U/L 20 30 15      RADIOGRAPHIC STUDIES: I have personally reviewed the radiological images as listed and agreed with the findings in the report. 6.1(Y CORE BIOPSY (LYMPH NODES)  Result Date: 04/02/2020 INDICATION: Persistent  bilateral cervical lymphadenopathy and need for lymph node biopsy. EXAM: ULTRASOUND GUIDED CORE BIOPSY OF right cervical MEDICATIONS: None. ANESTHESIA/SEDATION: Fentanyl 50 mcg IV; Versed 1.0 mg IV Moderate Sedation Time:  20 minutes. The patient was continuously monitored during the procedure by the interventional radiology nurse under my direct  supervision. PROCEDURE: The procedure, risks, benefits, and alternatives were explained to the patient. Questions regarding the procedure were encouraged and answered. The patient understands and consents to the procedure. A time-out was performed prior to initiating the procedure. Ultrasound was performed of lymph nodes in the neck bilaterally. The right neck was prepped with chlorhexidine in a sterile fashion, and a sterile drape was applied covering the operative field. A sterile gown and sterile gloves were used for the procedure. Local anesthesia was provided with 1% Lidocaine. A 16 gauge core biopsy device was utilized in obtaining 4 core biopsy samples of an enlarged right cervical lymph node corresponding to the largest right-sided lymph node seen by prior neck ultrasound. Core biopsy samples were submitted in saline. Additional ultrasound was performed. COMPLICATIONS: None immediate. FINDINGS: Enlarged right cervical lymph node measures approximately 2.9 x 1.1 x 2.3 cm. Solid core biopsy samples were obtained. IMPRESSION: Ultrasound-guided core biopsy performed of an enlarged right cervical lymph node. Electronically Signed   By: Irish Lack M.D.   On: 04/02/2020 09:44    ASSESSMENT & PLAN:   34 yo with   1) Progressive b/l cervical lymphadenopathy -- Bx consistent with Sarcoidosis. -02/24/2020 US Soft Tissue Head & Neck ([id number]) which revealed "IMPRESSION: Pathologic lymphadenopathy of the head/neck, and lymphoproliferative disorder/lymphoma or other malignancy not excluded."  PLAN: -Discussed pt labwork today, 04/09/20; blood counts are steady,  liver enzymes & kidney function appear nml -Discussed Sed rate & LDH are elevated, Ferritin & Sat Ratios are low, HIV & Hepatitis testing is negative.  -Discussed 04/02/2020 Right Cervical Lymph Node Needle Core Biopsy ([id number]) which revealed "- Non-caseating granulomatous lymphadenitis. - No malignancy identified." -Advised pt that labs and Biopsy suggest an inflammatory disorder, most likely Sarcoidosis.  -Advised pt that Sarcoidosis can manifest in many different ways. She could have mild disease, which typically consists of enlarged lymph nodes, or more significant disease, which could involve organs, most likely the lungs.  -Advised pt that Sarcoidosis is treated with steroids or other anti-inflammatory medications.  -Advised pt that further w/o may include CT scans, PFTs & EKG.  -Would refer pt to Rheumatology and/or Pulmonology based on CT.  -Advised pt that she has IDA. Recommend pt begin 150 mg Iron Polysaccharide BID. -Advised pt that Peridex can stain teeth if used long term. Pt could also use TheraBreath.  -Recommend pt f/u with prescribing physician for Peridex management.  -Will get CT Neck/Chest/Abd/Pel in 1 week -Will see back in 2 weeks via phone -Rx Iron Polysaccharide     FOLLOW UP: CT neck, chest, abd, pelvis in 1 week Phone visit with Dr Candise Che in 2 weeks   The total time spent in the appt was 30 minutes and more than 50% was on counseling and direct patient cares.  All of the patient's questions were answered with apparent satisfaction. The patient knows to call the clinic with any problems, questions or concerns.    Wyvonnia Lora MD MS AAHIVMS [facility] Hematology/Oncology Physician [facility]  (Office):       [phone] (Work cell):  [phone] (Fax):           [phone]  04/09/2020 11:06 AM  I, Carollee Herter, am acting as a scribe for Dr. Wyvonnia Lora.   .I have reviewed the above documentation for accuracy and completeness, and I  agree with the above. Johney Maine MD

## 2020-04-09 ENCOUNTER — Inpatient Hospital Stay (HOSPITAL_BASED_OUTPATIENT_CLINIC_OR_DEPARTMENT_OTHER): Payer: Private Health Insurance - Indemnity | Admitting: Hematology

## 2020-04-09 ENCOUNTER — Telehealth: Payer: Self-pay | Admitting: Hematology

## 2020-04-09 ENCOUNTER — Other Ambulatory Visit: Payer: Self-pay

## 2020-04-09 DIAGNOSIS — D869 Sarcoidosis, unspecified: Secondary | ICD-10-CM

## 2020-04-09 DIAGNOSIS — R591 Generalized enlarged lymph nodes: Secondary | ICD-10-CM | POA: Diagnosis not present

## 2020-04-09 MED ORDER — POLYSACCHARIDE IRON COMPLEX 150 MG PO CAPS: 150.0000 mg | ORAL_CAPSULE | Freq: Every day | ORAL | 3 refills | Status: DC

## 2020-04-09 NOTE — Telephone Encounter (Signed)
Scheduled appointments per 9/27 los. Gave patient calendar print out with appointment time and date. Patient is also aware appointment is a phone visit.

## 2020-04-13 ENCOUNTER — Ambulatory Visit: Payer: Private Health Insurance - Indemnity | Admitting: Nurse Practitioner

## 2020-04-17 ENCOUNTER — Telehealth: Payer: Self-pay | Admitting: Hematology

## 2020-04-17 NOTE — Telephone Encounter (Signed)
Called pt per 10/4 sch msg - no answer. Left message for patient to call back to reschedule apt.

## 2020-04-23 ENCOUNTER — Inpatient Hospital Stay: Payer: Managed Care, Other (non HMO) | Admitting: Hematology

## 2020-04-26 ENCOUNTER — Encounter (HOSPITAL_COMMUNITY): Payer: Self-pay

## 2020-04-26 ENCOUNTER — Other Ambulatory Visit: Payer: Self-pay

## 2020-04-26 ENCOUNTER — Ambulatory Visit (HOSPITAL_COMMUNITY)
Admission: RE | Admit: 2020-04-26 | Discharge: 2020-04-26 | Disposition: A | Discharge status: Home / Self Care | Payer: Private Health Insurance - Indemnity | Source: Ambulatory Visit | Attending: Hematology | Admitting: Hematology

## 2020-04-26 DIAGNOSIS — D869 Sarcoidosis, unspecified: Secondary | ICD-10-CM | POA: Diagnosis present

## 2020-04-26 MED ORDER — IOHEXOL 300 MG/ML  SOLN
100.0000 mL | Freq: Once | INTRAMUSCULAR | Status: AC | PRN
  Administered 2020-04-26: 100 mL via INTRAVENOUS

## 2020-04-30 ENCOUNTER — Inpatient Hospital Stay: Payer: Managed Care, Other (non HMO) | Attending: Hematology | Admitting: Hematology

## 2020-04-30 ENCOUNTER — Other Ambulatory Visit: Payer: Self-pay

## 2020-04-30 ENCOUNTER — Other Ambulatory Visit: Payer: Self-pay | Admitting: Hematology

## 2020-04-30 DIAGNOSIS — D869 Sarcoidosis, unspecified: Secondary | ICD-10-CM

## 2020-04-30 MED ORDER — PREDNISONE 20 MG PO TABS: ORAL_TABLET | ORAL | 0 refills | Status: AC

## 2020-04-30 NOTE — Progress Notes (Signed)
HEMATOLOGY/ONCOLOGY CONSULTATION NOTE  Date of Service: 04/30/2020  Patient Care Team: Claiborne Rigg, NP as PCP - General (Nurse Practitioner)  CHIEF COMPLAINTS/PURPOSE OF CONSULTATION:  Lymphadenopathy  HISTORY OF PRESENTING ILLNESS:   Sandra Nicholson is a wonderful 34 y.o. 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 The pt reports that she is doing well overall.   The pt reports that she has been healthy overall and denies a significant medical history. Pt had a tooth removed in March and received antibiotics. She had a second tooth removed in April and did not receive antibiotics after. In April she began to notice lumps in the left side and back of her neck. She then found an enlarged lymph node on the right side of her neck. These lymph nodes were stable in size and appearance for a time. In June pt began having a sore throat and was told that she had tonsil stones and lymphadenopathy. She was given Peridex and Zithromax. Her lymph nodes decreased in size somewhat after the antibiotics. Shortly after this pt had an allergic reaction to food, that caused sore throat again. In August she was given 20 mg Prednisone for 10 days by her PCP to improve her lymphadenopathy. Pt noticed the largest reduction in the size of her lymph nodes after steroids. Her throat soreness continues to return occasionally.   Between July and August pt had several skin nodules appear. She believes that these were caused by insect bites. She was given topical cream, which shrunk these nodules marginally. Pt continues using Hydrocortizone on these areas.   Pt is allergic to macadamia nuts and certain fish - she is not sure what kind. She has no family history of cancers or blood disorders. She does not come into contact with chemicals or animals in her work directly, but works outside quite a bit. Pt denies previous mononucleosis infection, previous  blood transfusions, unsafe sexual habits, or exposure to bodily fluids.  Of note since the patient's last visit, pt has had US Soft Tissue Head & Neck ([id number]) completed on 02/24/2020 with results revealing "IMPRESSION: Pathologic lymphadenopathy of the head/neck, and lymphoproliferative disorder/lymphoma or other malignancy not excluded."  Most recent lab results (02/15/2020) of CBC w/diff is as follows: all values are WNL except for Hgb at 10.8, MCV at 76, MCH at 24.1, RDW at 16.1.  On review of systems, pt reports lumps/bumps, occasional throat soreness, skin nodules and denies fevers, chills, night sweats, itching, low appetite, unexpected weight loss, bowel habit changes, urinary habit changes, breast lumps, SOB, vocal changes, dysphagia and any other symptoms.   On PMHx the pt reports Wisdom tooth extraction, Allergy. On Social Hx the pt reports that she is a non-smoker and does not use vapes or chew tobacco often. Pt denies heavy alcohol use.   INTERVAL HISTORY: I connected with  Sandra Nicholson on 04/30/20 by telephone and verified that I am speaking with the correct person using two identifiers.   I discussed the limitations of evaluation and management by telemedicine. The patient expressed understanding and agreed to proceed.  Other persons participating in the visit and their role in the encounter:       -Carollee Herter, Medical Scribe  Patient's location: Home Provider's location: [facility] [employer] D Mateo is a wonderful 34 y.o. 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 The patient's last visit with [place] was on 04/09/2020.  The pt reports that she is doing well overall.  The pt reports that she is experiencing stable lymphadenopathy. She denies any respiratory symptoms. Pt has an upcoming appointment with her PCP.  Of note since the patient's last visit, pt has had CT C/A/P ([id number]) completed on 04/26/2020 with results revealing "1.  Extensive lymphadenopathy in the chest and abdomen, as above, with a spectrum of findings in the lungs that is compatible with reported clinical history of sarcoidosis."  On review of systems, pt denies SOB, chest pain, abdominal pain, fevers, chills and any other symptoms.   MEDICAL HISTORY:  Past Medical History:  Diagnosis Date  . Allergy     SURGICAL HISTORY: Past Surgical History:  Procedure Laterality Date  . NO PAST SURGERIES      SOCIAL HISTORY: Social History   Socioeconomic History  . Marital status: Single    Spouse name: Not on file  . Number of children: Not on file  . Years of education: Not on file  . Highest education level: Not on file  Occupational History  . Not on file  Tobacco Use  . Smoking status: Former Games developer  . Smokeless tobacco: Never Used  Vaping Use  . Vaping Use: Never used  Substance and Sexual Activity  . Alcohol use: Yes    Alcohol/week: 0.0 standard drinks    Comment: occ  . Drug use: No  . Sexual activity: Not on file  Other Topics Concern  . Not on file  Social History Narrative  . Not on file   Social Determinants of Health   Financial Resource Strain:   . Difficulty of Paying Living Expenses: Not on file  Food Insecurity:   . Worried About Programme researcher, broadcasting/film/video in the Last Year: Not on file  . Ran Out of Food in the Last Year: Not on file  Transportation Needs:   . Lack of Transportation (Medical): Not on file  . Lack of Transportation (Non-Medical): Not on file  Physical Activity:   . Days of Exercise per Week: Not on file  . Minutes of Exercise per Session: Not on file  Stress:   . Feeling of Stress : Not on file  Social Connections:   . Frequency of Communication with Friends and Family: Not on file  . Frequency of Social Gatherings with Friends and Family: Not on file  . Attends Religious Services: Not on file  . Active Member of Clubs or Organizations: Not on file  . Attends Banker Meetings: Not on  file  . Marital Status: Not on file  Intimate Partner Violence:   . Fear of Current or Ex-Partner: Not on file  . Emotionally Abused: Not on file  . Physically Abused: Not on file  . Sexually Abused: Not on file    FAMILY HISTORY: Family History  Problem Relation Age of Onset  . Diabetes Father   . Hypertension Neg Hx     ALLERGIES:  is allergic to fish allergy and macadamia nut oil.  MEDICATIONS:  Current Outpatient Medications  Medication Sig Dispense Refill  . acetaminophen (TYLENOL) 500 MG tablet Take 1,000 mg by mouth every 6 (six) hours as needed for headache (pain).    . Aspirin-Acetaminophen-Caffeine (GOODY HEADACHE PO) Take 1 packet by mouth 2 (two) times daily as needed (pain).    . chlorhexidine (PERIDEX) 0.12 % solution Use as directed 15 mLs in the mouth or throat 4 (four) times daily. (Patient not taking: Reported on 03/27/2020) 240 mL 1  .  iron polysaccharides (NIFEREX) 150 MG capsule Take 1 capsule (150 mg total) by mouth daily. 60 capsule 3  . Multiple Vitamin (MULTIVITAMIN WITH MINERALS) TABS tablet Take 1 tablet by mouth daily.    . predniSONE (DELTASONE) 20 MG tablet Take 3 tablets (60 mg total) by mouth daily with breakfast for 3 days, THEN 2.5 tablets (50 mg total) daily with breakfast for 4 days, THEN 2 tablets (40 mg total) daily with breakfast for 5 days, THEN 1 tablet (20 mg total) daily with breakfast for 7 days, THEN 0.5 tablets (10 mg total) daily with breakfast for 7 days, THEN 0.5 tablets (10 mg total) every other day for 10 days. 42 tablet 0   No current facility-administered medications for this visit.    REVIEW OF SYSTEMS:   A 10+ POINT REVIEW OF SYSTEMS WAS OBTAINED including neurology, dermatology, psychiatry, cardiac, respiratory, lymph, extremities, GI, GU, Musculoskeletal, constitutional, breasts, reproductive, HEENT.  All pertinent positives are noted in the HPI.  All others are negative.   PHYSICAL EXAMINATION: ECOG PERFORMANCE STATUS: 1 -  Symptomatic but completely ambulatory  . Telehealth visit   LABORATORY DATA:  I have reviewed the data as listed  . CBC Latest Ref Rng & Units 04/02/2020 03/22/2020 02/15/2020  WBC 4.0 - 10.5 K/uL 3.9(L) 4.8 4.5  Hemoglobin 12.0 - 15.0 g/dL 11.3(L) 11.0(L) 10.8(L)  Hematocrit 36 - 46 % 38.6 35.6(L) 34.2  Platelets 150 - 400 K/uL 415(H) 389 400    . CMP Latest Ref Rng & Units 03/22/2020 10/23/2019 03/02/2017  Glucose 70 - 99 mg/dL 86 92 952(W100(H)  BUN 6 - 20 mg/dL 9 10 5(L)  Creatinine 4.130.44 - 1.00 mg/dL 2.440.83 0.100.81 2.72(Z1.04(H)  Sodium 135 - 145 mmol/L 137 137 138  Potassium 3.5 - 5.1 mmol/L 4.2 3.8 3.6  Chloride 98 - 111 mmol/L 105 103 105  CO2 22 - 32 mmol/L 25 24 26   Calcium 8.9 - 10.3 mg/dL 9.5 9.3 9.2  Total Protein 6.5 - 8.1 g/dL 3.6(U8.7(H) 7.7 7.1  Total Bilirubin 0.3 - 1.2 mg/dL 4.4(I0.2(L) 3.4(V0.2(L) 4.2(V0.2(L)  Alkaline Phos 38 - 126 U/L 104 114 47  AST 15 - 41 U/L 29 39 23  ALT 0 - 44 U/L 20 30 15      RADIOGRAPHIC STUDIES: I have personally reviewed the radiological images as listed and agreed with the findings in the report. CT CHEST ABDOMEN PELVIS W CONTRAST  Result Date: 04/26/2020 CLINICAL DATA:  34 year old female with history of recent diagnosis of sarcoidosis. Lymphadenopathy. EXAM: CT CHEST, ABDOMEN, AND PELVIS WITH CONTRAST TECHNIQUE: Multidetector CT imaging of the chest, abdomen and pelvis was performed following the standard protocol during bolus administration of intravenous contrast. CONTRAST:  100mL OMNIPAQUE IOHEXOL 300 MG/ML  SOLN COMPARISON:  No priors. FINDINGS: CT CHEST FINDINGS Cardiovascular: Heart size is normal. There is no significant pericardial fluid, thickening or pericardial calcification. No atherosclerotic calcifications in the thoracic aorta or the coronary arteries. Mediastinum/Nodes: Extensive bilateral mediastinal and hilar lymphadenopathy. The bulkiest lymph node is in the subcarinal nodal station measuring up to 2.3 cm in short axis. Esophagus is unremarkable in  appearance. No axillary lymphadenopathy. Lungs/Pleura: Unusual areas of micro nodularity, some of which form into confluent macro nodules. These findings are most evident in the mid to upper lungs bilaterally, particularly in the periphery and in a peribronchovascular distribution. Mild micronodularity along the fissures. No pleural effusions. Musculoskeletal: There are no aggressive appearing lytic or blastic lesions noted in the visualized portions of the skeleton. CT ABDOMEN PELVIS  FINDINGS Hepatobiliary: No suspicious cystic or solid hepatic lesions. No intra or extrahepatic biliary ductal dilatation. Gallbladder is normal in appearance. Pancreas: No pancreatic mass. No pancreatic ductal dilatation. No pancreatic or peripancreatic fluid collections or inflammatory changes. Spleen: Unremarkable. Adrenals/Urinary Tract: Bilateral kidneys and adrenal glands are normal in appearance. No hydroureteronephrosis. Urinary bladder is normal in appearance. Stomach/Bowel: Normal appearance of the stomach. No pathologic dilatation of small bowel or colon. Normal appendix. Vascular/Lymphatic: No significant atherosclerotic disease, aneurysm or dissection noted in the abdominal or pelvic vasculature. Extensive lymphadenopathy noted in the abdomen, most evident in the retroperitoneum and upper abdomen. Near confluent pericaval and para-aortic lymphadenopathy is well demonstrated on axial image 58 of series 2. Bulky nodal mass in the superior aspect of the hepatic hilum (axial image 52 of series 2) measuring 3.5 x 4.4 cm. No definite pelvic lymphadenopathy. Reproductive: Uterus and ovaries are unremarkable in appearance. Tampon present in the vagina. Other: No significant volume of ascites.  No pneumoperitoneum. Musculoskeletal: There are no aggressive appearing lytic or blastic lesions noted in the visualized portions of the skeleton. IMPRESSION: 1. Extensive lymphadenopathy in the chest and abdomen, as above, with a spectrum  of findings in the lungs that is compatible with reported clinical history of sarcoidosis. 2. Additional incidental findings, as above. Electronically Signed   By: Trudie Reed M.D.   On: 04/26/2020 16:59   [place] CORE BIOPSY (LYMPH NODES)  Result Date: 04/02/2020 INDICATION: Persistent bilateral cervical lymphadenopathy and need for lymph node biopsy. EXAM: ULTRASOUND GUIDED CORE BIOPSY OF right cervical MEDICATIONS: None. ANESTHESIA/SEDATION: Fentanyl 50 mcg IV; Versed 1.0 mg IV Moderate Sedation Time:  20 minutes. The patient was continuously monitored during the procedure by the interventional radiology nurse under my direct supervision. PROCEDURE: The procedure, risks, benefits, and alternatives were explained to the patient. Questions regarding the procedure were encouraged and answered. The patient understands and consents to the procedure. A time-out was performed prior to initiating the procedure. Ultrasound was performed of lymph nodes in the neck bilaterally. The right neck was prepped with chlorhexidine in a sterile fashion, and a sterile drape was applied covering the operative field. A sterile gown and sterile gloves were used for the procedure. Local anesthesia was provided with 1% Lidocaine. A 16 gauge core biopsy device was utilized in obtaining 4 core biopsy samples of an enlarged right cervical lymph node corresponding to the largest right-sided lymph node seen by prior neck ultrasound. Core biopsy samples were submitted in saline. Additional ultrasound was performed. COMPLICATIONS: None immediate. FINDINGS: Enlarged right cervical lymph node measures approximately 2.9 x 1.1 x 2.3 cm. Solid core biopsy samples were obtained. IMPRESSION: Ultrasound-guided core biopsy performed of an enlarged right cervical lymph node. Electronically Signed   By: Irish Lack M.D.   On: 04/02/2020 09:44    ASSESSMENT & PLAN:   34 yo with   1) Progressive b/l cervical lymphadenopathy -- Bx consistent  with Sarcoidosis. -02/24/2020 US Soft Tissue Head & Neck ([id number]) which revealed "IMPRESSION: Pathologic lymphadenopathy of the head/neck, and lymphoproliferative disorder/lymphoma or other malignancy not excluded."  PLAN: -Discussed 04/26/2020 CT C/A/P ([id number]) which revealed lymphadenopathy in chest and abdomen with lung involvement.  -Will send short-course of Prednisone to improve lymphadenopathy. Recommend pt take in the morning with breakfast. -Advised pt that she must f/u with Pulmonology and Rheumatology for long-term Tx of Sarcoidosis. -Will refer pt to Pulmonology and Rheumatology. -Recommend pt f/u with PCP as scheduled. 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  All of the patient's questions were answered with apparent satisfaction. The patient knows to call the clinic with any problems, questions or concerns.    Wyvonnia Lora MD MS AAHIVMS [facility] Hematology/Oncology Physician [facility]  (Office):       [phone] (Work cell):  [phone] (Fax):           [phone]  04/30/2020 3:41 PM  I, Carollee Herter, am acting as a scribe for Dr. Wyvonnia Lora.   .I have reviewed the above documentation for accuracy and completeness, and I agree with the above. Johney Maine MD

## 2020-05-02 ENCOUNTER — Telehealth: Payer: Self-pay

## 2020-05-02 NOTE — Telephone Encounter (Signed)
Copied from CRM [id number]. Topic: General - Other >> May 01, 2020  3:03 PM Wyonia Hough E wrote: Reason for CRM: Pt went to oncologist and wanted to speak with Zelda about the visit and see if she looked at the notes from the visit with the specialist / please advise

## 2020-05-03 NOTE — Telephone Encounter (Signed)
Needs to f/u with Oncology. I can not go over their notes with her.

## 2020-05-04 NOTE — Telephone Encounter (Signed)
Attempt to reach patient to inform. No answer and LVM.

## 2020-05-28 ENCOUNTER — Other Ambulatory Visit: Payer: Self-pay

## 2020-05-28 ENCOUNTER — Ambulatory Visit: Payer: Managed Care, Other (non HMO) | Attending: Nurse Practitioner | Admitting: Nurse Practitioner

## 2020-06-05 ENCOUNTER — Institutional Professional Consult (permissible substitution): Payer: Private Health Insurance - Indemnity | Admitting: Pulmonary Disease

## 2020-06-05 NOTE — Progress Notes (Deleted)
Synopsis: Referred by Johney Maine, MD for evaluation of sarcoidosis  Subjective:   PATIENT ID: Sandra Nicholson GENDER: female DOB: Jul 21, 1985, MRN: [id number]   HPI  No chief complaint on file.   ***  Past Medical History:  Diagnosis Date  . Allergy      Family History  Problem Relation Age of Onset  . Diabetes Father   . Hypertension Neg Hx      Social History   Socioeconomic History  . Marital status: Single    Spouse name: Not on file  . Number of children: Not on file  . Years of education: Not on file  . Highest education level: Not on file  Occupational History  . Not on file  Tobacco Use  . Smoking status: Former Games developer  . Smokeless tobacco: Never Used  Vaping Use  . Vaping Use: Never used  Substance and Sexual Activity  . Alcohol use: Yes    Alcohol/week: 0.0 standard drinks    Comment: occ  . Drug use: No  . Sexual activity: Not on file  Other Topics Concern  . Not on file  Social History Narrative  . Not on file   Social Determinants of Health   Financial Resource Strain:   . Difficulty of Paying Living Expenses: Not on file  Food Insecurity:   . Worried About Programme researcher, broadcasting/film/video in the Last Year: Not on file  . Ran Out of Food in the Last Year: Not on file  Transportation Needs:   . Lack of Transportation (Medical): Not on file  . Lack of Transportation (Non-Medical): Not on file  Physical Activity:   . Days of Exercise per Week: Not on file  . Minutes of Exercise per Session: Not on file  Stress:   . Feeling of Stress : Not on file  Social Connections:   . Frequency of Communication with Friends and Family: Not on file  . Frequency of Social Gatherings with Friends and Family: Not on file  . Attends Religious Services: Not on file  . Active Member of Clubs or Organizations: Not on file  . Attends Banker Meetings: Not on file  . Marital Status: Not on file  Intimate Partner Violence:   . Fear of Current or  Ex-Partner: Not on file  . Emotionally Abused: Not on file  . Physically Abused: Not on file  . Sexually Abused: Not on file     Allergies  Allergen Reactions  . Fish Allergy Itching and Swelling    Mouth and throat swelling  . Macadamia Nut Oil Itching and Swelling    Mouth and throat swelling     Outpatient Medications Prior to Visit  Medication Sig Dispense Refill  . acetaminophen (TYLENOL) 500 MG tablet Take 1,000 mg by mouth every 6 (six) hours as needed for headache (pain).    . Aspirin-Acetaminophen-Caffeine (GOODY HEADACHE PO) Take 1 packet by mouth 2 (two) times daily as needed (pain).    . chlorhexidine (PERIDEX) 0.12 % solution Use as directed 15 mLs in the mouth or throat 4 (four) times daily. (Patient not taking: Reported on 03/27/2020) 240 mL 1  . iron polysaccharides (NIFEREX) 150 MG capsule Take 1 capsule (150 mg total) by mouth daily. 60 capsule 3  . Multiple Vitamin (MULTIVITAMIN WITH MINERALS) TABS tablet Take 1 tablet by mouth daily.    . predniSONE (DELTASONE) 20 MG tablet Take 3 tablets (60 mg total) by mouth daily with breakfast for 3 days,  THEN 2.5 tablets (50 mg total) daily with breakfast for 4 days, THEN 2 tablets (40 mg total) daily with breakfast for 5 days, THEN 1 tablet (20 mg total) daily with breakfast for 7 days, THEN 0.5 tablets (10 mg total) daily with breakfast for 7 days, THEN 0.5 tablets (10 mg total) every other day for 10 days. 42 tablet 0   No facility-administered medications prior to visit.    ROS    Objective:  There were no vitals filed for this visit.   Physical Exam    CBC    Component Value Date/Time   WBC 3.9 (L) 04/02/2020 0602   RBC 5.10 04/02/2020 0602   HGB 11.3 (L) 04/02/2020 0602   HGB 10.8 (L) 02/15/2020 1444   HCT 38.6 04/02/2020 0602   HCT 34.2 02/15/2020 1444   PLT 415 (H) 04/02/2020 0602   PLT 400 02/15/2020 1444   MCV 75.7 (L) 04/02/2020 0602   MCV 76 (L) 02/15/2020 1444   MCH 22.2 (L) 04/02/2020 0602    MCHC 29.3 (L) 04/02/2020 0602   RDW 16.7 (H) 04/02/2020 0602   RDW 16.1 (H) 02/15/2020 1444   LYMPHSABS 1.3 03/22/2020 1614   LYMPHSABS 1.4 02/15/2020 1444   MONOABS 0.8 03/22/2020 1614   EOSABS 0.1 03/22/2020 1614   EOSABS 0.2 02/15/2020 1444   BASOSABS 0.0 03/22/2020 1614   BASOSABS 0.0 02/15/2020 1444   Chest imaging: CT Chest 04/26/20 1. Extensive lymphadenopathy in the chest and abdomen, as above, with a spectrum of findings in the lungs that is compatible with reported clinical history of sarcoidosis. 2. Additional incidental findings, as above.  PFT: No flowsheet data found.  Path: 04/02/20 A. LYMPH NODE, RIGHT CERVICAL, NEEDLE CORE BIOPSY:  - Non-caseating granulomatous lymphadenitis, see comment.  - No malignancy identified.   COMMENT: Core biopsies reveal abundant well formed non-caseating granulomas. PAS,  GMS, and AFB are negative for organisms. CD20 and CD3 reveal residual  B-cells and T-cells, respectively. The main differential consideration  is sarcoidosis.   EKG 10/24/19 HR 62, Normal sinus rhythm PR Interval 151 QTc 379 QRS 62    Assessment & Plan:   No diagnosis found.  Discussion: ***    Current Outpatient Medications:  .  acetaminophen (TYLENOL) 500 MG tablet, Take 1,000 mg by mouth every 6 (six) hours as needed for headache (pain)., Disp: , Rfl:  .  Aspirin-Acetaminophen-Caffeine (GOODY HEADACHE PO), Take 1 packet by mouth 2 (two) times daily as needed (pain)., Disp: , Rfl:  .  chlorhexidine (PERIDEX) 0.12 % solution, Use as directed 15 mLs in the mouth or throat 4 (four) times daily. (Patient not taking: Reported on 03/27/2020), Disp: 240 mL, Rfl: 1 .  iron polysaccharides (NIFEREX) 150 MG capsule, Take 1 capsule (150 mg total) by mouth daily., Disp: 60 capsule, Rfl: 3 .  Multiple Vitamin (MULTIVITAMIN WITH MINERALS) TABS tablet, Take 1 tablet by mouth daily., Disp: , Rfl:  .  predniSONE (DELTASONE) 20 MG tablet, Take 3 tablets (60 mg total) by  mouth daily with breakfast for 3 days, THEN 2.5 tablets (50 mg total) daily with breakfast for 4 days, THEN 2 tablets (40 mg total) daily with breakfast for 5 days, THEN 1 tablet (20 mg total) daily with breakfast for 7 days, THEN 0.5 tablets (10 mg total) daily with breakfast for 7 days, THEN 0.5 tablets (10 mg total) every other day for 10 days., Disp: 42 tablet, Rfl: 0

## 2020-06-12 NOTE — Progress Notes (Signed)
Office Visit Note  Patient: Sandra Nicholson             Date of Birth: 1986-03-13           MRN: [id number]             PCP: Gildardo Pounds, NP Referring: Brunetta Genera, MD Visit Date: 06/13/2020   Subjective:  New Patient (Initial Visit) and Lymphadenopathy (neck)   History of Present Illness: Sandra Nicholson is a 34 y.o. female here for evaluation and management of sarcoidosis. Symptoms began around March with lymphadenopathy after teeth cleaning and extractions that became persistent. Infectious workup was unremarkable. She had some associated fatigue and night sweats and she was evaluated at the hospital in July with subsequent clinic visit in August then had ultrasound and cervical lymph node biopsy with pathology consistent with sarcoidosis. CT scan of the chest in October showed extensive hilar adenopathy. She was treated with a course of prednisone that largely improved her lymph nodes but since completing this a few weeks ago they have increased in size again. She is also noticing some skin bumps on her arms but no itching or pain. She denies fevers, vision change or eye pain, cough or shortness of breath, or significant joint pains. She denies any family history of autoimmune disease.  Labs reviewed 03/2020 WBC 3.9 Hgb 11.3 MCV 75.7 Plts 415 CMP normal ESR 28 LDH 290 Iron sat 6%  Imaging reviewed 04/2020 CT Chest Extensive lymphadenopathy in the chest and abdomen, as above, with a spectrum of findings in the lungs that is compatible with reported clinical history of sarcoidosis.  Pathology reviewed 03/2020 Right cervical lymph node biopsy pathology consistent with sarcoidosis.  Activities of Daily Living:  Patient reports morning stiffness for 0 minutes.   Patient Denies nocturnal pain.  Difficulty dressing/grooming: Denies Difficulty climbing stairs: Denies Difficulty getting out of chair: Denies Difficulty using hands for taps, buttons, cutlery, and/or  writing: Denies  Review of Systems  Constitutional: Negative for fatigue.  HENT: Negative for mouth sores, mouth dryness and nose dryness.   Eyes: Negative for pain, itching, visual disturbance and dryness.  Respiratory: Negative for cough, hemoptysis, shortness of breath and difficulty breathing.   Cardiovascular: Negative for chest pain, palpitations and swelling in legs/feet.  Gastrointestinal: Negative for abdominal pain, blood in stool, constipation and diarrhea.  Endocrine: Negative for increased urination.  Genitourinary: Negative for painful urination.  Musculoskeletal: Positive for myalgias and myalgias. Negative for arthralgias, joint pain, joint swelling, muscle weakness, morning stiffness and muscle tenderness.  Skin: Negative for color change, rash and redness.  Allergic/Immunologic: Negative for susceptible to infections.  Neurological: Negative for dizziness, numbness, headaches, memory loss and weakness.  Hematological: Positive for swollen glands.  Psychiatric/Behavioral: Positive for sleep disturbance. Negative for confusion.    PMFS History:  Patient Active Problem List   Diagnosis Date Noted  . Sarcoidosis 06/13/2020  . Iron deficiency anemia 06/13/2020    Past Medical History:  Diagnosis Date  . Allergy     Family History  Problem Relation Age of Onset  . Diabetes Father   . Hypertension Father   . Kidney disease Father    Past Surgical History:  Procedure Laterality Date  . LYMPH NODE BIOPSY  03/2020   Per patient  . NO PAST SURGERIES    . WISDOM TOOTH EXTRACTION  2021   Per patient   Social History   Social History Narrative  . Not on file    There  is no immunization history on file for this patient.   Objective: Vital Signs: BP 94/61 (BP Location: Left Arm, Patient Position: Sitting, Cuff Size: Small)   Pulse 88   Ht '5\' 6"'  (1.676 m)   Wt 161 lb 9.6 oz (73.3 kg)   BMI 26.08 kg/m    Physical Exam Constitutional:      Appearance:  Normal appearance.  HENT:     Right Ear: External ear normal.     Left Ear: External ear normal.     Mouth/Throat:     Mouth: Mucous membranes are moist.     Pharynx: Oropharynx is clear.  Eyes:     Conjunctiva/sclera: Conjunctivae normal.  Cardiovascular:     Rate and Rhythm: Normal rate and regular rhythm.  Pulmonary:     Effort: Pulmonary effort is normal.     Breath sounds: Normal breath sounds.  Lymphadenopathy:     Head:     Right side of head: Occipital adenopathy present.     Left side of head: Preauricular and occipital adenopathy present.     Cervical: Cervical adenopathy present.     Right cervical: Superficial cervical adenopathy present.     Left cervical: Posterior cervical adenopathy present.     Upper Body:     Left upper body: Supraclavicular adenopathy present.     Comments: Multiple 1-1.5cm lymph nodes, left occipital most notably large, 2cm diameter  Skin:    General: Skin is warm and dry.     Comments: Multiple tattoos on arms, skin colored papules present most on left upper arm limited to tattooed skin areas  Neurological:     Mental Status: She is alert.      Musculoskeletal Exam: Neck full range of motion no tenderness Shoulder, elbow, wrist, fingers full range of motion no tenderness or swelling Normal hip internal and external rotation without pain, no tenderness to lateral hip palpation Knees, ankles, MTPs full range of motion no tenderness or swelling    Investigation: No additional findings.  Imaging: No results found.  Recent Labs: Lab Results  Component Value Date   WBC 3.9 (L) 04/02/2020   HGB 11.3 (L) 04/02/2020   PLT 415 (H) 04/02/2020   NA 137 03/22/2020   K 4.2 03/22/2020   CL 105 03/22/2020   CO2 25 03/22/2020   GLUCOSE 86 03/22/2020   BUN 9 03/22/2020   CREATININE 0.83 03/22/2020   BILITOT 0.2 (L) 03/22/2020   ALKPHOS 104 03/22/2020   AST 29 03/22/2020   ALT 20 03/22/2020   PROT 8.7 (H) 03/22/2020   ALBUMIN 3.8  03/22/2020   CALCIUM 9.5 03/22/2020   GFRAA >60 03/22/2020    Speciality Comments: No specialty comments available.  Procedures:  No procedures performed Allergies: Fish allergy and Macadamia nut oil   Assessment / Plan:     Visit Diagnoses: Sarcoidosis - Plan: Vitamin D 1,25 dihydroxy, VITAMIN D 25 Hydroxy (Vit-D Deficiency, Fractures), QuantiFERON-TB Gold Plus, Urinalysis, Protein / creatinine ratio, urine, CBC with Differential/Platelet, COMPLETE METABOLIC PANEL WITH GFR, Ambulatory referral to Ophthalmology  New sarcoidosis diagnosis current workup includes normal EKG test and CT chest. We will check urinalysis, TB, vitamin D 25 and 1,25 and repeat CBC and CMP. She has upcoming appt with pulmonology later this month input on whether asymptomatic pulmonary function changes are present could be helpful. Will refer to ophthalmology for screening and discussed high prevalence of asymptomatic uveitis. No very urgent indications to treat just rash and lymphadenopathy noticeable at this time so  will plan to follow up after labs and pulmonology evaluation.  Iron deficiency anemia, unspecified iron deficiency anemia type  She is now taking an iron supplement daily but for less than 3 months. IDA with 6% iron sat and 75 MCV. Will repeat CBC today also for previous leukopenia.  Orders: Orders Placed This Encounter  Procedures  . Vitamin D 1,25 dihydroxy  . VITAMIN D 25 Hydroxy (Vit-D Deficiency, Fractures)  . QuantiFERON-TB Gold Plus  . Urinalysis  . Protein / creatinine ratio, urine  . CBC with Differential/Platelet  . COMPLETE METABOLIC PANEL WITH GFR  . Ambulatory referral to Ophthalmology   No orders of the defined types were placed in this encounter.   Follow-Up Instructions: Return in about 4 weeks (around 07/11/2020).   Collier Salina, MD  Note - This record has been created using [employer].  Chart creation errors have been sought, but may not always  have been  located. Such creation errors do not reflect on  the standard of medical care.

## 2020-06-13 ENCOUNTER — Ambulatory Visit (INDEPENDENT_AMBULATORY_CARE_PROVIDER_SITE_OTHER): Payer: Managed Care, Other (non HMO) | Admitting: Internal Medicine

## 2020-06-13 ENCOUNTER — Encounter: Payer: Self-pay | Admitting: Internal Medicine

## 2020-06-13 ENCOUNTER — Other Ambulatory Visit: Payer: Self-pay

## 2020-06-13 VITALS — BP 94/61 | HR 88 | Ht 66.0 in | Wt 161.6 lb

## 2020-06-13 DIAGNOSIS — D869 Sarcoidosis, unspecified: Secondary | ICD-10-CM

## 2020-06-13 DIAGNOSIS — D509 Iron deficiency anemia, unspecified: Secondary | ICD-10-CM | POA: Diagnosis not present

## 2020-06-13 NOTE — Patient Instructions (Addendum)
We are checking for evidence of sarcoidosis affecting any major organs such as the kidneys. You have upcoming pulmonology appointment 12/21 they will evaluate for any lung function changes. I am placing referral for ophthalmology to examine any eye inflammation changes. This eye inflammation is often without symptoms so needs to be screened.   Sarcoidosis  Sarcoidosis is a disease that can cause inflammation in many areas of the body. It most often affects the lungs (pulmonary sarcoidosis). Sarcoidosis can also affect the lymph nodes, liver, eyes, skin, heart, or any other body tissue. Normally, cells that are part of your body's disease-fighting system (immune system) attack harmful substances (such as germs) in your body. This immune system response causes inflammation. After the harmful substance is destroyed, the inflammation and the immune cells go away. When you have sarcoidosis, your immune system causes inflammation even when there are no harmful substances, and the inflammation does not go away. Sarcoidosis also causes cells from your immune system to form small clumps of tissue (granulomas) in the affected area of your body. What are the causes? The exact cause of sarcoidosis is not known.  It is possible that if you have a family history of this disease (genetic predisposition), the immune system response that leads to inflammation may be triggered by something in your environment, such as:  Bacteria or viruses.  Metals.  Chemicals.  Dust.  Mold or mildew. What increases the risk? You may be at a greater risk for sarcoidosis if you:  Have a family history of the disease.  Are African-American.  Are of Northern European descent.  Are 25-20 years old.  Work as a IT sales professional.  Work in an environment where you are exposed to metals, chemicals, mold or mildew, or insecticides. What are the signs or symptoms? Some people with sarcoidosis have no symptoms. Others have very mild  symptoms. The symptoms usually depend on the organ that is affected. Sarcoidosis most often affects the lungs, which may include symptoms such as:  Chest pain.  Coughing.  Wheezing.  Shortness of breath. Other common symptoms include:  Night sweats.  Fever.  Weight loss.  Fatigue.  Swollen lymph nodes.  Joint pain. How is this diagnosed? Sarcoidosis may be diagnosed based on:  Your symptoms and medical history.  A physical exam.  Imaging tests to check for granulomas such as: ? Chest X-ray. ? CT scan. ? MRI. ? PET scan.  Lung function tests. These tests evaluate your breathing and check for problems that may be related to sarcoidosis.  A procedure to remove a tissue sample for testing (biopsy). You may have a biopsy of lung tissue if that is where you are having symptoms. You may have tests to check for any complications of the condition. These tests may include:  Eye exams.  MRI of the heart or brain.  Echocardiogram.  Electrocardiogram (EKG or ECG). How is this treated? In some cases, sarcoidosis does not require a specific treatment because it causes no symptoms or mild symptoms. If your symptoms bother you or are severe, you may be prescribed medicines to reduce inflammation or relieve symptoms. These medicines may include:  Prednisone. This is a steroid that reduces inflammation related to sarcoidosis.  Hydroxychloroquine. This may be used to treat sarcoidosis that affects the skin, eyes, or brain.  Methotrexate, leflunomide, or azathioprine. These medicines affect the immune system and can help with sarcoidosis in the joints, eyes, skin, or lungs.  Medicines that you breathe in (inhalers). Inhalers can help you breathe  if sarcoidosis affects your lungs. Follow these instructions at home:   Do not use any products that contain nicotine or tobacco, such as cigarettes and e-cigarettes. If you need help quitting, ask your health care provider.  Avoid  secondhand smoke and irritating dust or chemicals. Stay indoors on days when air quality is poor in your area.  Return to your normal activities as told by your health care provider. Ask your health care provider what activities are safe for you.  Take or use over-the-counter and prescription medicines only as told by your health care provider.  Keep all follow-up visits as told by your health care provider. This is important. Contact a health care provider if:  You have vision problems.  You have a dry cough that does not go away.  You have an irregular heartbeat.  You have pain or aches in your joints, hands, or feet.  You have an unexplained rash. Get help right away if:  You have chest pain.  You have difficulty breathing. Summary  Sarcoidosis is a disease that can cause inflammation in many body areas of the body. It most often affects the lungs (pulmonary sarcoidosis). It can also affect the lymph nodes, liver, eyes, skin, heart, or any other body tissue.  When you have sarcoidosis, cells from your immune system form small clumps of tissue (granulomas) in the affected area of your body.  Sarcoidosis sometimes does not require a specific treatment because it causes no symptoms or mild symptoms.  If your symptoms bother you or are severe, you may be prescribed medicines to reduce inflammation or relieve symptoms.

## 2020-06-17 LAB — CBC WITH DIFFERENTIAL/PLATELET
Absolute Monocytes: 757 cells/uL (ref 200–950)
Basophils Absolute: 30 cells/uL (ref 0–200)
Basophils Relative: 0.7 %
Eosinophils Absolute: 39 cells/uL (ref 15–500)
Eosinophils Relative: 0.9 %
HCT: 39.4 % (ref 35.0–45.0)
Hemoglobin: 12.3 g/dL (ref 11.7–15.5)
Lymphs Abs: 1157 cells/uL (ref 850–3900)
MCH: 23.8 pg — ABNORMAL LOW (ref 27.0–33.0)
MCHC: 31.2 g/dL — ABNORMAL LOW (ref 32.0–36.0)
MCV: 76.2 fL — ABNORMAL LOW (ref 80.0–100.0)
MPV: 9.9 fL (ref 7.5–12.5)
Monocytes Relative: 17.6 %
Neutro Abs: 2318 cells/uL (ref 1500–7800)
Neutrophils Relative %: 53.9 %
Platelets: 444 10*3/uL — ABNORMAL HIGH (ref 140–400)
RBC: 5.17 10*6/uL — ABNORMAL HIGH (ref 3.80–5.10)
RDW: 20.3 % — ABNORMAL HIGH (ref 11.0–15.0)
Total Lymphocyte: 26.9 %
WBC: 4.3 10*3/uL (ref 3.8–10.8)

## 2020-06-17 LAB — COMPLETE METABOLIC PANEL WITH GFR
AG Ratio: 1.1 (calc) (ref 1.0–2.5)
ALT: 36 U/L — ABNORMAL HIGH (ref 6–29)
AST: 39 U/L — ABNORMAL HIGH (ref 10–30)
Albumin: 4 g/dL (ref 3.6–5.1)
Alkaline phosphatase (APISO): 86 U/L (ref 31–125)
BUN: 12 mg/dL (ref 7–25)
CO2: 26 mmol/L (ref 20–32)
Calcium: 9.7 mg/dL (ref 8.6–10.2)
Chloride: 102 mmol/L (ref 98–110)
Creat: 0.83 mg/dL (ref 0.50–1.10)
GFR, Est African American: 107 mL/min/{1.73_m2} (ref >=60)
GFR, Est Non African American: 92 mL/min/{1.73_m2} (ref >=60)
Globulin: 3.5 g/dL (calc) (ref 1.9–3.7)
Glucose, Bld: 77 mg/dL (ref 65–99)
Potassium: 4.3 mmol/L (ref 3.5–5.3)
Sodium: 136 mmol/L (ref 135–146)
Total Bilirubin: 0.3 mg/dL (ref 0.2–1.2)
Total Protein: 7.5 g/dL (ref 6.1–8.1)

## 2020-06-17 LAB — VITAMIN D 1,25 DIHYDROXY
Vitamin D 1, 25 (OH)2 Total: 115 pg/mL — ABNORMAL HIGH (ref 18–72)
Vitamin D2 1, 25 (OH)2: <8 pg/mL
Vitamin D3 1, 25 (OH)2: 115 pg/mL

## 2020-06-17 LAB — QUANTIFERON-TB GOLD PLUS
Mitogen-NIL: 3.86 IU/mL
NIL: 0.17 IU/mL
QuantiFERON-TB Gold Plus: NEGATIVE
TB1-NIL: 0.02 IU/mL
TB2-NIL: 0.04 IU/mL

## 2020-06-17 LAB — VITAMIN D 25 HYDROXY (VIT D DEFICIENCY, FRACTURES): Vit D, 25-Hydroxy: 26 ng/mL — ABNORMAL LOW (ref 30–100)

## 2020-06-18 ENCOUNTER — Telehealth: Payer: Self-pay

## 2020-06-18 NOTE — Telephone Encounter (Signed)
Patient left a voicemail requesting a return call regarding her labwork results and a medication that was going to be sent to [facility].

## 2020-06-21 NOTE — Telephone Encounter (Signed)
I attempted several times to return call regarding lab results so we could discuss but try to contact her for update. Labs are consistent with active sarcoidosis specifically elevated platelet count and her high vitamin d 1,25 and low vitamin d is from sarcoidosis. I recommend we wait to start any treatment though until after pulmonology evaluation which was scheduled for 12/21 in order to get the most accurate diagnosis.

## 2020-06-22 NOTE — Telephone Encounter (Signed)
Attempted to contact patient, left voicemail advising patient to call the office.  

## 2020-06-25 NOTE — Telephone Encounter (Signed)
Attempted to contact patient, left voicemail advising patient to call the office.   Also sent patient MyChart message.

## 2020-06-25 NOTE — Progress Notes (Signed)
Labs are consistent with active sarcoidosis specifically elevated platelet count and her high vitamin d 1,25 and low vitamin d is from sarcoidosis. I recommend we wait to start any treatment though until after pulmonology evaluation which was scheduled for 12/21 in order to get the most accurate diagnosis.

## 2020-06-29 ENCOUNTER — Telehealth: Payer: Self-pay | Admitting: Radiology

## 2020-06-29 NOTE — Telephone Encounter (Signed)
Spoke with patient, advised labs are consistent with active sarcoidosis specifically elevated platelet count and her high vitamin d 1,25 and low vitamin d is from sarcoidosis. Dr. Dimple Casey recommends we wait to start any treatment though until after pulmonology evaluation which is scheduled for 12/21 in order to get the most accurate diagnosis.  Patient was advised to have notes from pulmonology visit faxed to our office.

## 2020-07-03 ENCOUNTER — Ambulatory Visit (INDEPENDENT_AMBULATORY_CARE_PROVIDER_SITE_OTHER): Payer: Managed Care, Other (non HMO) | Admitting: Pulmonary Disease

## 2020-07-03 ENCOUNTER — Other Ambulatory Visit: Payer: Self-pay

## 2020-07-03 ENCOUNTER — Encounter: Payer: Self-pay | Admitting: Pulmonary Disease

## 2020-07-03 ENCOUNTER — Telehealth: Payer: Self-pay | Admitting: Pulmonary Disease

## 2020-07-03 VITALS — BP 112/70 | HR 93 | Temp 97.4°F | Ht 66.0 in | Wt 161.0 lb

## 2020-07-03 DIAGNOSIS — D869 Sarcoidosis, unspecified: Secondary | ICD-10-CM

## 2020-07-03 DIAGNOSIS — J988 Other specified respiratory disorders: Secondary | ICD-10-CM

## 2020-07-03 DIAGNOSIS — R070 Pain in throat: Secondary | ICD-10-CM

## 2020-07-03 MED ORDER — MAGIC MOUTHWASH: 5.0000 mL | Freq: Two times a day (BID) | ORAL | 0 refills | Status: AC | PRN

## 2020-07-03 MED ORDER — LIDOCAINE VISCOUS HCL 2 % MT SOLN: 10.0000 mL | Freq: Every evening | OROMUCOSAL | 2 refills | Status: DC | PRN

## 2020-07-03 MED ORDER — BUDESONIDE-FORMOTEROL FUMARATE 80-4.5 MCG/ACT IN AERO: 2.0000 | INHALATION_SPRAY | Freq: Two times a day (BID) | RESPIRATORY_TRACT | 12 refills | Status: DC

## 2020-07-03 MED ORDER — MAGIC MOUTHWASH: 5.0000 mL | Freq: Two times a day (BID) | ORAL | 0 refills | Status: DC | PRN

## 2020-07-03 NOTE — Telephone Encounter (Signed)
Spoke with MH about this. He will change the prescription and resend it to the pharmacy.

## 2020-07-03 NOTE — Patient Instructions (Addendum)
Nice to meet you  Use Symbicort 2 puffs twice a day for congestion, possible contribution of asthma.  Rinse your mouth out after every use and spit.  For the throat pain, try a short course of Magic mouthwash.  We need to get breathing tests to evaluate if the function of your lung is normal or not.  If there is abnormality, I would recommend we move forward with steroid of prednisone treatment of presumed sarcoid in the lung.  If the breathing tests are normal, we will plan to repeat a CT scan next month.  If that CT scan is normal and the breathing tests are normal, I am relatively confident that the sarcoid is no longer affecting your lungs.  If either test indicates concern that the sarcoid could be affecting the lungs, will discuss next steps.  If there is still uncertainty, we can discuss pursuing a bronchoscopy or procedure to look for signs of sarcoidosis in the lungs.  I think we both would like to avoid this, we will do everything we can to be competent of the right decision without needing that invasive procedure.  However, I am confident we can do it very safely if needed.  Return to clinic in 6 weeks with Dr. Judeth Horn.

## 2020-07-03 NOTE — Progress Notes (Signed)
pft   @Patient  ID: Sandra Nicholson, female    DOB: 12-Jul-1986, 34 y.o.   MRN: [id number]  Chief Complaint  Patient presents with   Consult    Referred by Dr. [id number] for possible sarcoidosis. Suspected after recent lymph nodes responding well to prednisone.     Referring provider: Wyvonnia Lora, MD  HPI:   34 year old whom we are seeing at the request of Dr. 20 for evaluation of sarcoidosis.  Note from referring provider reviewed.  Note from rheumatology reviewed.  Patient overall doing relatively well.  She is noted over the last several months swelling of her lymph nodes.  Most notably preauricular bilaterally.  This was bothersome to her.  Not particularly painful but cosmetically unappealing.  This led to additional work-up with all exam showed diffuse lymphadenopathy in the neck.  Anterior cervical chain on the right was biopsied with pathology consistent with noncaseating granulomas.  Felt related to sarcoidosis.  She has received a couple courses of steroids for presumed sarcoid or lymphadenopathy.  Both lasting a few days.  Most recently treated with Solu-Medrol Dosepak 04/2020.  She says this did intermittently reduce the size of the lymph nodes.  Obese have CT chest 04/2020 which on my interpretation shows diffuse nodular opacities favored to reflect endobronchial mucous or nodules.  Shortly after that CT scan she did receive her most recent dose of steroid therapy which was a bit higher in a bit longer than the initial dose.  She denies any significant cough.  Her chief complaint truly is posterior oropharynx or throat pain.  She has been told she has tonsil stones.  She thinks is all started with a dental infection some months ago.  That is under control and was adequately treated per her report.  She mainly has intermittent posterior pharynx or throat pain.  She has been evaluated by her PCP this for many times.  Noted to have swollen tonsils intermittently with tonsil  stones.  Advised conservative treatment.  Discussed this at length that I do not think is related to her sarcoidosis but could.  Nicholson discussion about neck steps internal sarcoidosis.  Fortunately she is asymptomatic from a respiratory standpoint.  No rush for therapy.  She does have infiltrates on CT scan which could be consistent with sarcoidosis.  Fortunately, she did receive a short dose of steroids after that CT scan.  Advised on future work-up and what may be required.  Counseled her that I want to be sure that prednisone therapy was in her best interest, given her lack of symptoms there is no rush to institute this therapy and I must be reassured that they will be beneficial to her.  PMH: Sarcoidosis Surgical history: Posterior cervical lymph node biopsy, wisdom tooth extraction Family history: Father with diabetes, hypertension, kidney disease Social history: Lives in [place], works full-time, never smoker  [place] / Pulmonary Flowsheets:   ACT:  No flowsheet data found.  MMRC: No flowsheet data found.  Epworth:  No flowsheet data found.  Tests:   FENO:  No results found for: NITRICOXIDE  PFT: No flowsheet data found.  WALK:  No flowsheet data found.  Imaging: Personally reviewed as per EMR discussion of this note  Lab Results: Personally reviewed, notably eosinophils 200 02/2020 CBC    Component Value Date/Time   WBC 4.3 06/13/2020 0000   RBC 5.17 (H) 06/13/2020 0000   HGB 12.3 06/13/2020 0000   HGB 10.8 (L) 02/15/2020 1444   HCT 39.4 06/13/2020 0000  HCT 34.2 02/15/2020 1444   PLT 444 (H) 06/13/2020 0000   PLT 400 02/15/2020 1444   MCV 76.2 (L) 06/13/2020 0000   MCV 76 (L) 02/15/2020 1444   MCH 23.8 (L) 06/13/2020 0000   MCHC 31.2 (L) 06/13/2020 0000   RDW 20.3 (H) 06/13/2020 0000   RDW 16.1 (H) 02/15/2020 1444   LYMPHSABS 1,157 06/13/2020 0000   LYMPHSABS 1.4 02/15/2020 1444   MONOABS 0.8 03/22/2020 1614   EOSABS 39 06/13/2020 0000   EOSABS  0.2 02/15/2020 1444   BASOSABS 30 06/13/2020 0000   BASOSABS 0.0 02/15/2020 1444    BMET    Component Value Date/Time   NA 136 06/13/2020 0000   K 4.3 06/13/2020 0000   CL 102 06/13/2020 0000   CO2 26 06/13/2020 0000   GLUCOSE 77 06/13/2020 0000   BUN 12 06/13/2020 0000   CREATININE 0.83 06/13/2020 0000   CALCIUM 9.7 06/13/2020 0000   GFRNONAA 92 06/13/2020 0000   GFRAA 107 06/13/2020 0000    BNP No results found for: BNP  ProBNP No results found for: PROBNP  Specialty Problems   None     Allergies  Allergen Reactions   Fish Allergy Itching and Swelling    Mouth and throat swelling   Macadamia Nut Oil Itching and Swelling    Mouth and throat swelling     There is no immunization history on file for this patient.  Past Medical History:  Diagnosis Date   Allergy     Tobacco History: Social History   Tobacco Use  Smoking Status Never Smoker  Smokeless Tobacco Never Used   Counseling given: Not Answered   Continue to not smoke  Outpatient Encounter Medications as of 07/03/2020  Medication Sig   acetaminophen (TYLENOL) 500 MG tablet Take 1,000 mg by mouth every 6 (six) hours as needed for headache (pain).   Aspirin-Acetaminophen-Caffeine (GOODY HEADACHE PO) Take 1 packet by mouth 2 (two) times daily as needed (pain).   chlorhexidine (PERIDEX) 0.12 % solution Use as directed 15 mLs in the mouth or throat 4 (four) times daily. (Patient taking differently: Use as directed 15 mLs in the mouth or throat as needed.)   iron polysaccharides (NIFEREX) 150 MG capsule Take 1 capsule (150 mg total) by mouth daily.   Multiple Vitamin (MULTIVITAMIN WITH MINERALS) TABS tablet Take 1 tablet by mouth daily.   No facility-administered encounter medications on file as of 07/03/2020.     Review of Systems  Review of Systems  No chest pain with exertion, no nasal congestion.  No yellowing of the eyes or itchy skin.  Comprehensive review systems otherwise  negative. Physical Exam  BP 112/70    Pulse 93    Temp (!) 97.4 F (36.3 C) (Temporal)    Ht 5\' 6"  (1.676 m)    Wt 161 lb (73 kg)    SpO2 100% Comment: on RA   BMI 25.99 kg/m   Wt Readings from Last 5 Encounters:  07/03/20 161 lb (73 kg)  06/13/20 161 lb 9.6 oz (73.3 kg)  04/09/20 158 lb 8 oz (71.9 kg)  04/02/20 158 lb (71.7 kg)  03/22/20 158 lb 3.2 oz (71.8 kg)    BMI Readings from Last 5 Encounters:  07/03/20 25.99 kg/m  06/13/20 26.08 kg/m  04/09/20 24.82 kg/m  04/02/20 24.75 kg/m  03/22/20 24.78 kg/m     Physical Exam General: Well-appearing, no acute distress Eyes: EOMI, no icterus Neck: Supple, anterior cervical lymphadenopathy, preauricular lymphadenopathy, posterior cervical lymphadenopathy  noted on exam, no JVP appreciated Throat: Left tonsil mildly swollen and inflamed, 2 visible tonsil stones Respiratory: Clear to auscultate bilaterally, no wheeze, no crackles Cardiovascular: Regular rhythm, no murmurs Abdomen: Nondistended, bowel sounds present MSK: No synovitis, no joint effusions Neuro: Normal gait, no weakness Psych: Normal mood, full affect   Assessment & Plan:   Sarcoidosis: Based on posterior cervical lymphadenopathy biopsy with noncaseating granulomas on pathology.  She reports decrease in lymphadenopathy and short courses of steroids.  In terms of pulmonary involvement, she does have CT 04/2020 scan evidence nodules, suspect related to mucus impaction possibly with or without contribution of sarcoidosis.  She has no respiratory issues.  No real cough with the exception of occasional congestion.  Will obtain PFTs.  If normal, will plan to move forward with repeat CT scan.  Unclear to me if steroid therapy would benefit her in terms of symptoms.  If PFTs abnormal, would plan to move forward with steroid therapy.  If repeat CT scan is then abnormal, discussion of initiating steroid therapy versus bronchoscopy with BAL to send fluid studies to see if  consistent with sarcoidosis (lymphocyte predominance, increased CD4 to CD8 ratio).  Chest congestion: Positive related to posterior oropharynx issues, tonsil stones noted.  Also possible related to sarcoidosis above.  Also possible secondary such as asthma.  Will trial Symbicort to see if this helps with with very mild symptoms.  Posterior pharynx pain: Likely related to tonsil stones, inflammation.  Unclear sarcoid contributing.  Recommend warm salt water gargling, small amount of viscous lidocaine sent to pharmacy to help with pain for now.   Return in about 6 weeks (around 08/14/2020).   Karren Burly, MD 07/03/2020

## 2020-07-04 ENCOUNTER — Telehealth: Payer: Self-pay | Admitting: Pulmonary Disease

## 2020-07-04 NOTE — Telephone Encounter (Signed)
Called and spoke with the pharmacists at [facility] and she will cancel out the MMW and fill the lidocaine rx.  Nothing further is needed.

## 2020-07-04 NOTE — Telephone Encounter (Signed)
MH   Please advise on the lidocaine that was sent in for the MMW.  The pharmacy is STILL confused on how they should mix this up.  Please advise. Thanks

## 2020-07-04 NOTE — Telephone Encounter (Signed)
Cancel magic mouthwash and ask they provide the viscous lidocaine as prescribed. Thanks!

## 2020-07-09 ENCOUNTER — Telehealth: Payer: Self-pay | Admitting: Pulmonary Disease

## 2020-07-09 ENCOUNTER — Encounter: Payer: Self-pay | Admitting: Pulmonary Disease

## 2020-07-09 NOTE — Telephone Encounter (Signed)
Pt returning call from office . Please advise  

## 2020-07-09 NOTE — Telephone Encounter (Signed)
Spoke with the pt  She states symbicort cost over $130 even with a coupon  She was not able to obtain and wants alternative  I advised her to please call her insurance and ask for list of more affordable options  She states that she has already done this, and they told her that the symbicort was the only option that could be prescribed I tried explaining again what to do, b/c I think she misunderstood, but she stated several times she already called insurance and they would not give her alternatives Dr Judeth Horn off all week and pt with no maintenance inhaler so will forward to APP of the day and pt aware no response back until 12/28

## 2020-07-09 NOTE — Telephone Encounter (Signed)
LMTCB

## 2020-07-09 NOTE — Telephone Encounter (Signed)
07/09/2020  Can prescribe the patient:  AirDuo RespiClick 55 mcg / 14 mcg 1 puff twice daily Rinse mouth out after use  Please explained to the patient that she can try to utilize this prescription with a good Rx coupon and pay out-of-pocket.  Should be around $30.  Elisha Headland, FNP

## 2020-07-10 NOTE — Telephone Encounter (Signed)
Left message for patient to call back  

## 2020-07-11 MED ORDER — AIRDUO DIGIHALER 55-14 MCG/ACT IN AEPB: 1.0000 | INHALATION_SPRAY | Freq: Every day | RESPIRATORY_TRACT | 3 refills | Status: DC

## 2020-07-11 NOTE — Telephone Encounter (Signed)
Spoke with pt and reviewed Brian's recommendations. Ordered Airduo and advised pt on good Rx coupon. Pt stated understanding. Nothing further needed at this time

## 2020-07-11 NOTE — Telephone Encounter (Signed)
Lmtcb for pt.  

## 2020-07-16 ENCOUNTER — Ambulatory Visit: Payer: Managed Care, Other (non HMO) | Admitting: Internal Medicine

## 2020-07-17 ENCOUNTER — Other Ambulatory Visit (HOSPITAL_COMMUNITY)
Admission: RE | Admit: 2020-07-17 | Discharge: 2020-07-17 | Disposition: A | Discharge status: Home / Self Care | Payer: Managed Care, Other (non HMO) | Source: Ambulatory Visit | Attending: Pulmonary Disease | Admitting: Pulmonary Disease

## 2020-07-17 DIAGNOSIS — Z20822 Contact with and (suspected) exposure to covid-19: Secondary | ICD-10-CM | POA: Diagnosis not present

## 2020-07-17 DIAGNOSIS — Z01818 Encounter for other preprocedural examination: Secondary | ICD-10-CM | POA: Insufficient documentation

## 2020-07-17 LAB — SARS CORONAVIRUS 2 (TAT 6-24 HRS): SARS Coronavirus 2: NEGATIVE

## 2020-07-20 ENCOUNTER — Telehealth: Payer: Self-pay | Admitting: Pulmonary Disease

## 2020-07-20 NOTE — Telephone Encounter (Signed)
LMTCB

## 2021-01-08 ENCOUNTER — Other Ambulatory Visit: Payer: Self-pay | Admitting: Hematology

## 2021-05-17 IMAGING — US US SOFT TISSUE HEAD/NECK
1 series · 14 of 25 positions shown · non-contrast
Comparison: None.

CLINICAL DATA: 34-year-old female with a history of lymphadenopathy

EXAM:
ULTRASOUND OF HEAD/NECK SOFT TISSUES
TECHNIQUE: Ultrasound examination of the head and neck soft tissues was
performed in the area of clinical concern.

[Series 1: us soft tissue head & neck (non-thyroid) · 14 of 61 slices shown]
[im 1/61]
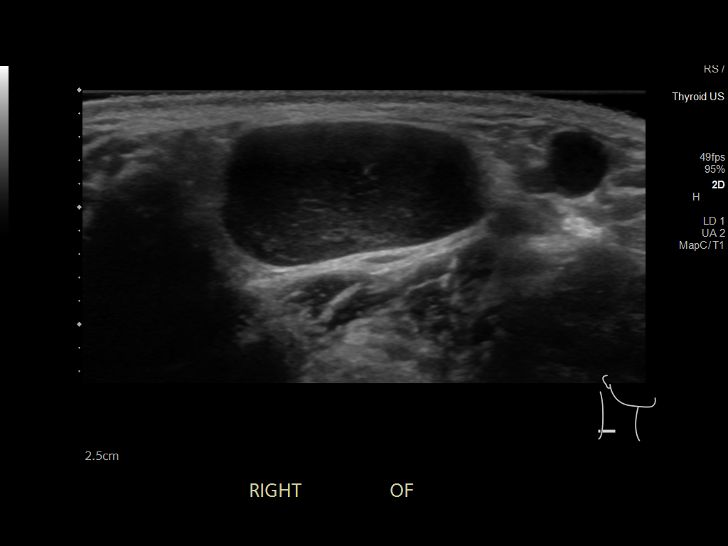
[im 6/61]
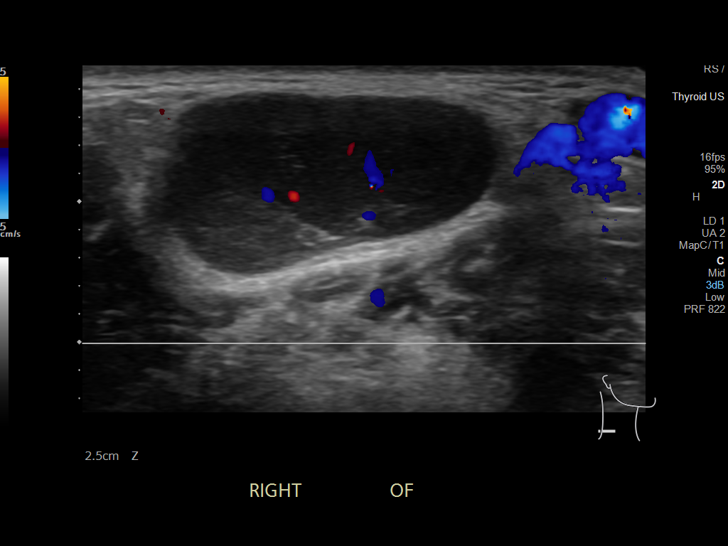
[im 11/61]
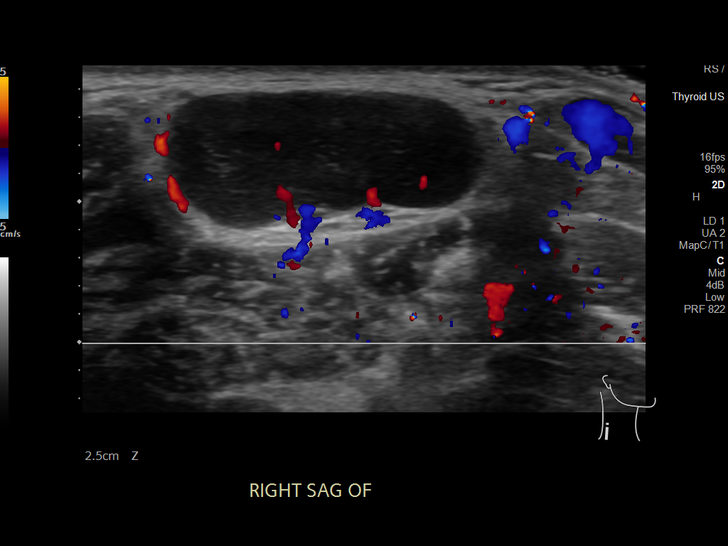
[im 16/61]
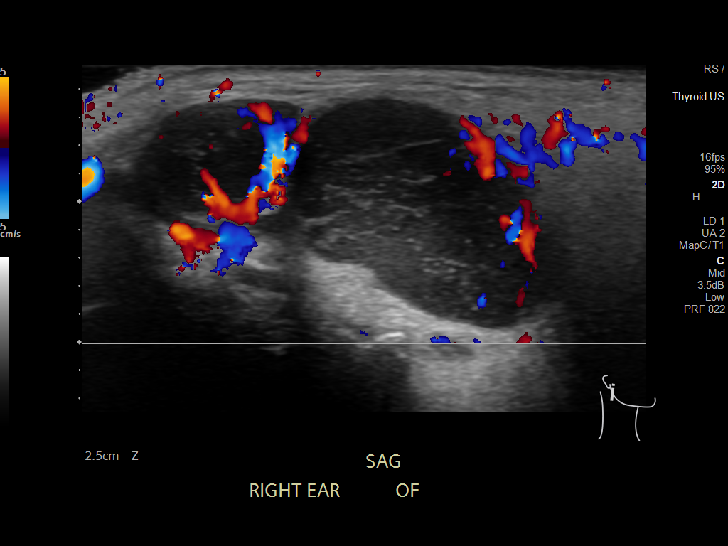
[im 21/61]
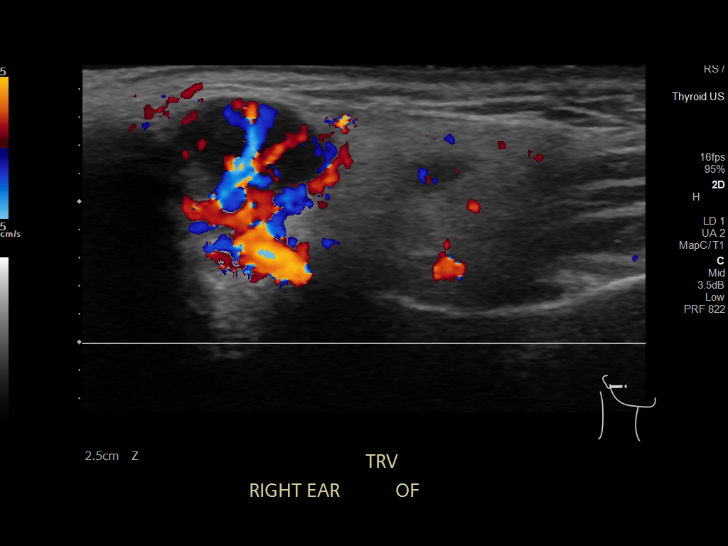
[im 23/61]
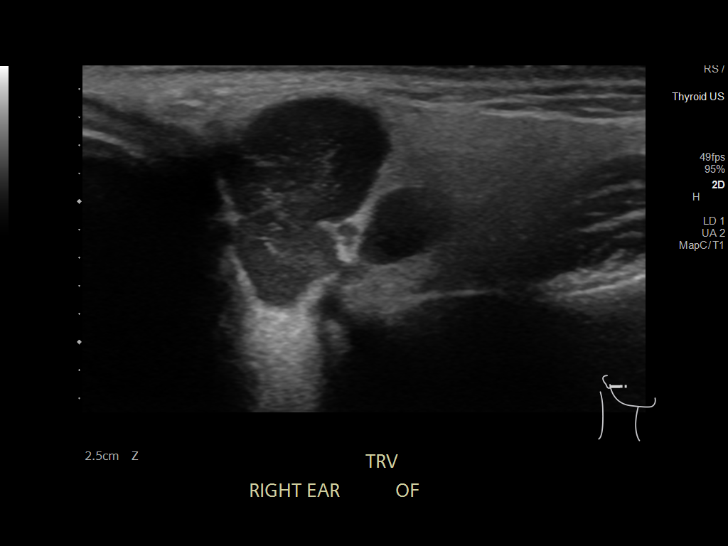
[im 28/61]
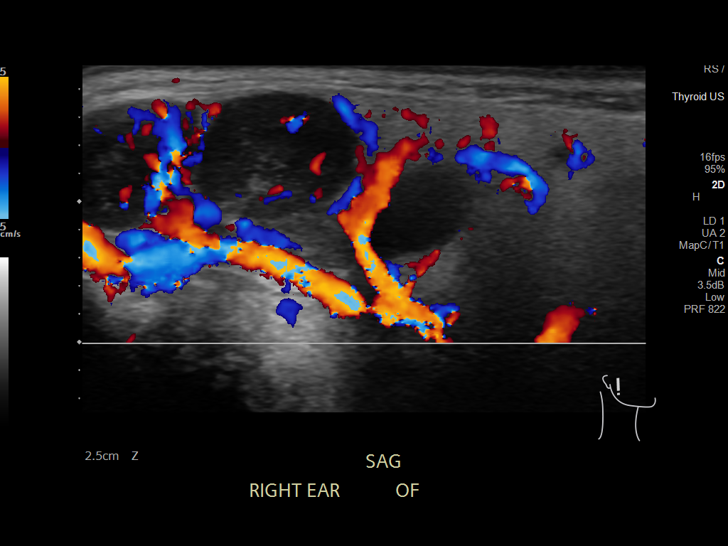
[im 33/61]
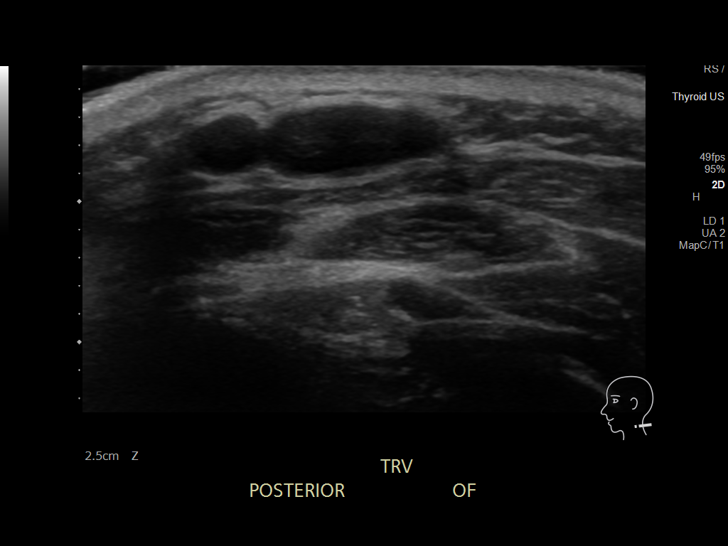
[im 38/61]
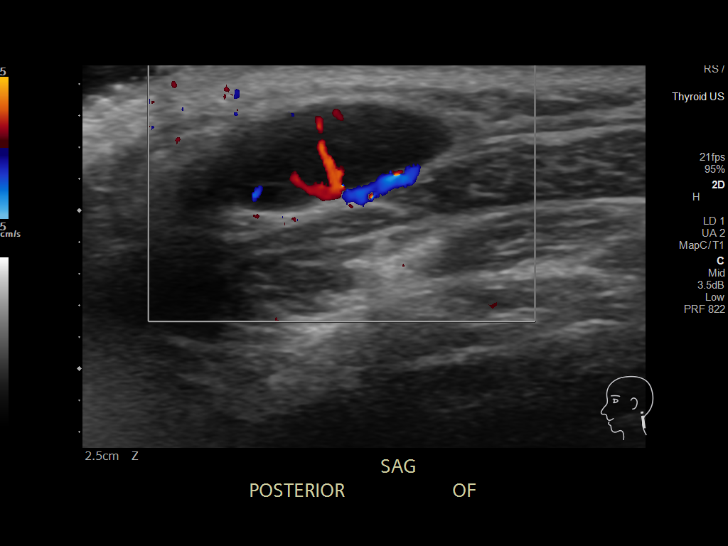
[im 41/61]
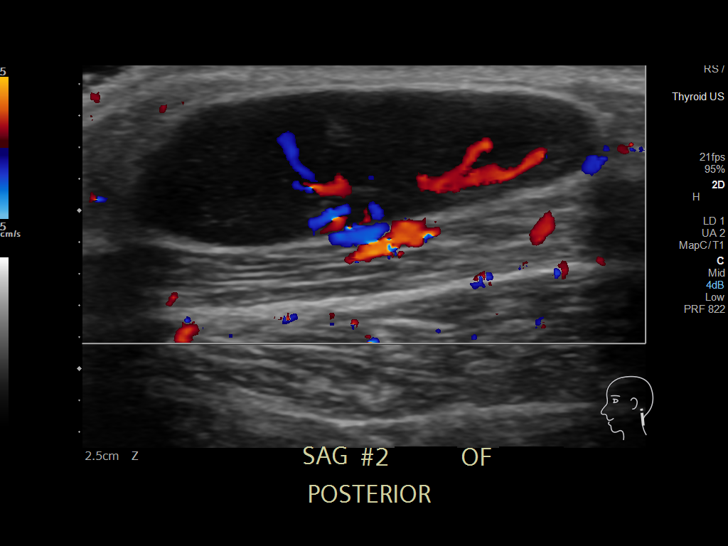
[im 46/61]
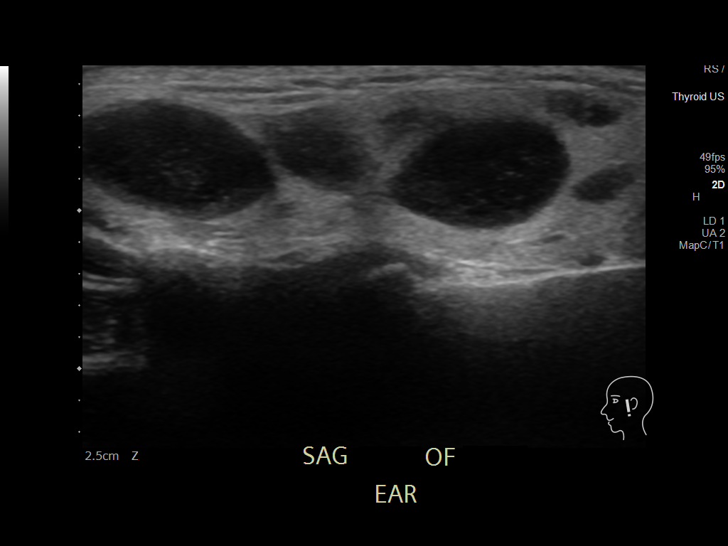
[im 51/61]
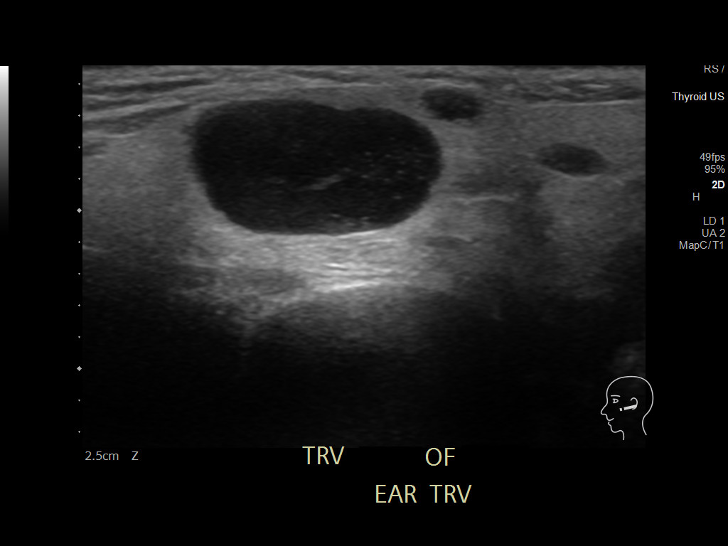
[im 56/61]
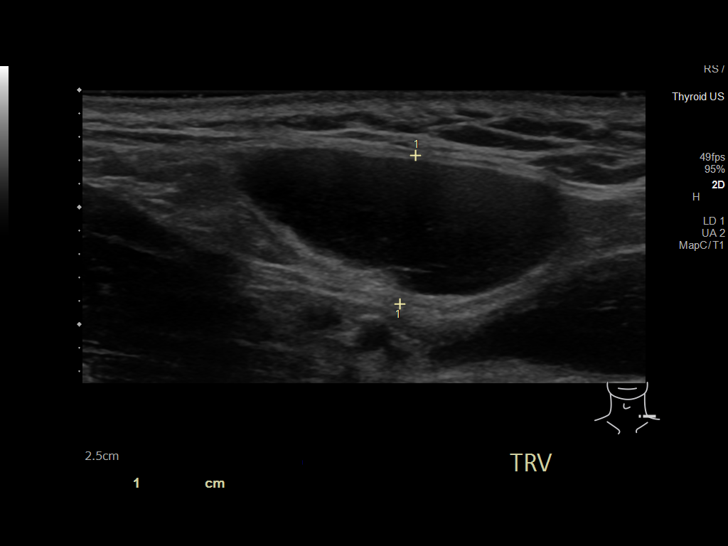
[im 61/61]
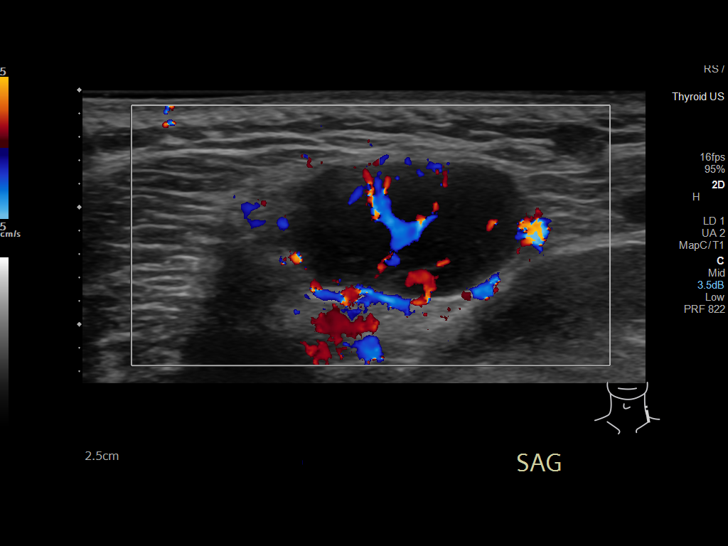

[14 of 25 positions shown; findings below may reference images not displayed]

FINDINGS: Grayscale and color duplex performed in the region clinical concern.

Several enlarged pathologic appearing lymph nodes are present.
Largest measures short axis 1.3 cm.

No focal fluid.
IMPRESSION: Pathologic lymphadenopathy of the head/neck, and lymphoproliferative
disorder/lymphoma or other malignancy not excluded.

## 2021-06-11 ENCOUNTER — Encounter: Payer: Self-pay | Admitting: Nurse Practitioner

## 2021-06-24 IMAGING — US US BIOPSY LYMPH NODE
1 series · 12 of 12 positions shown · non-contrast
Comparison: none

INDICATION: Persistent bilateral cervical lymphadenopathy and need for lymph
node biopsy.

[Series 1: us core biopsy (lymph nodes) · 12 of 12 slices shown]
[im 1/12]
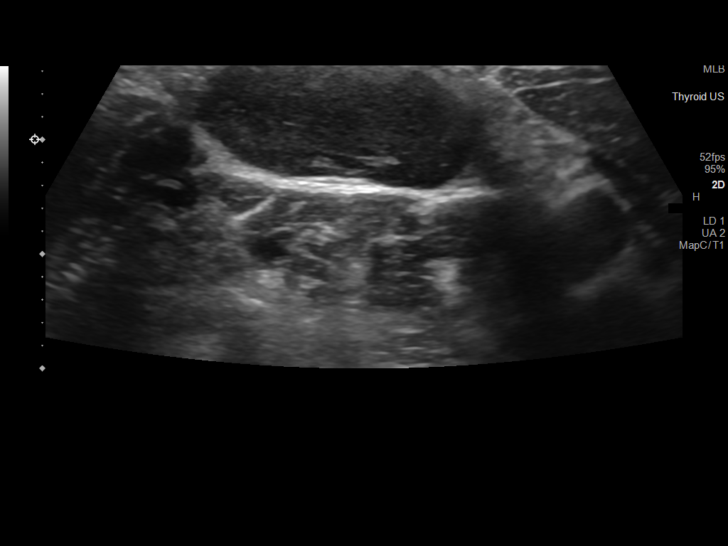
[im 2/12]
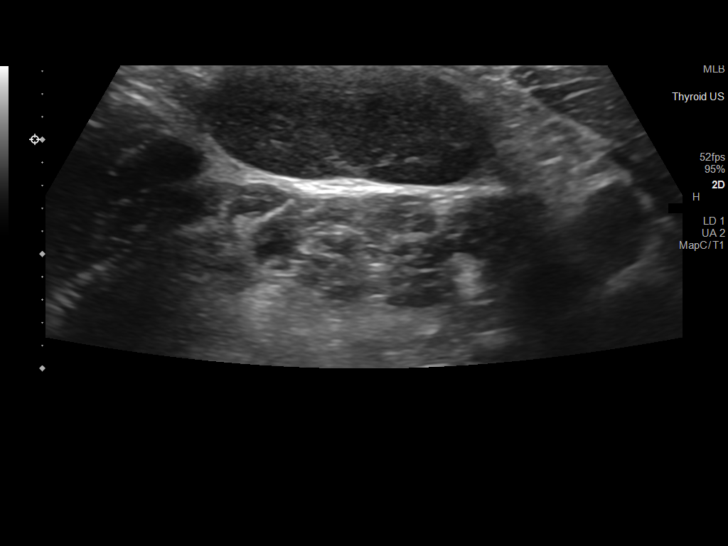
[im 3/12]
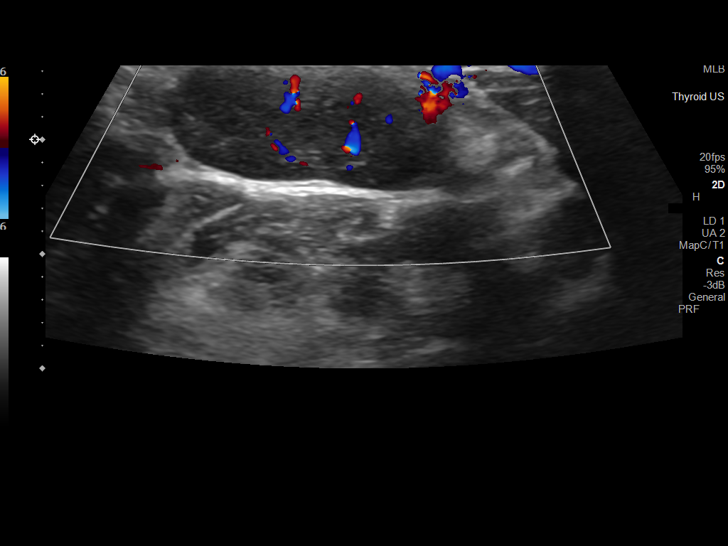
[im 4/12]
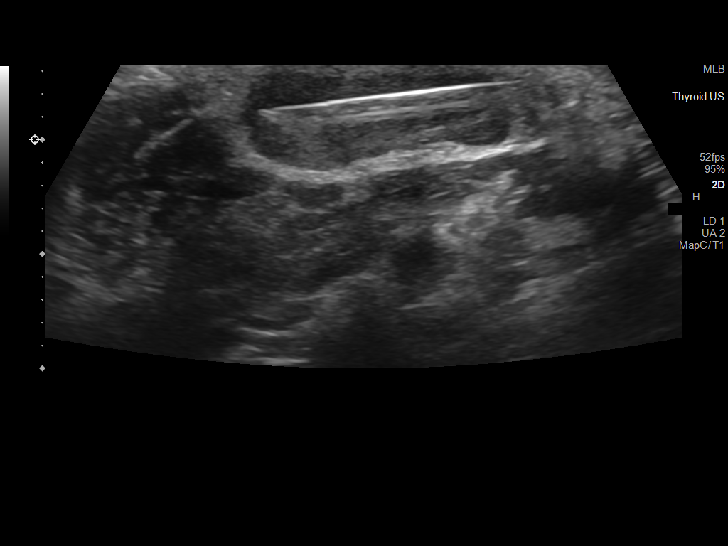
[im 5/12]
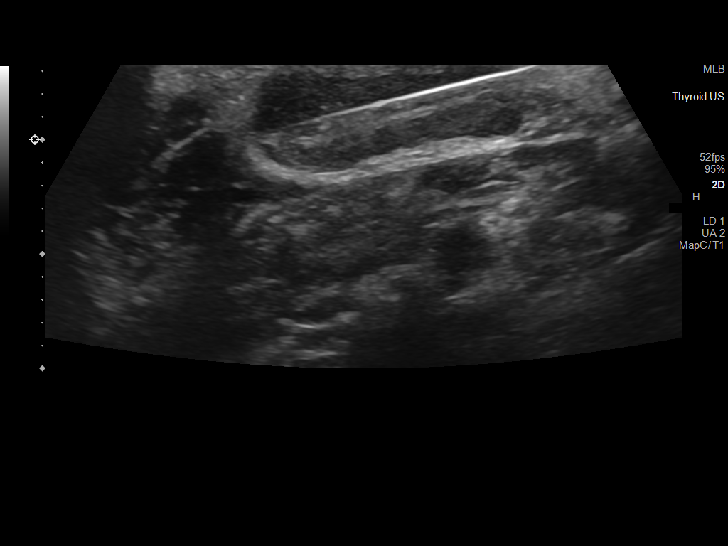
[im 6/12]
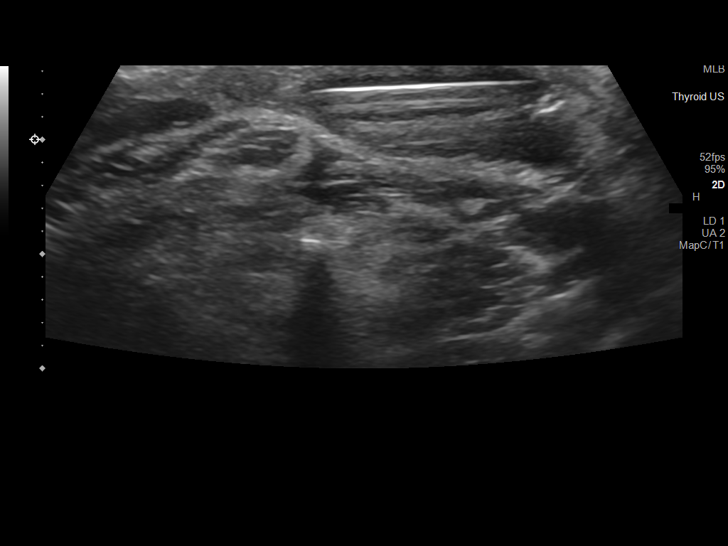
[im 7/12]
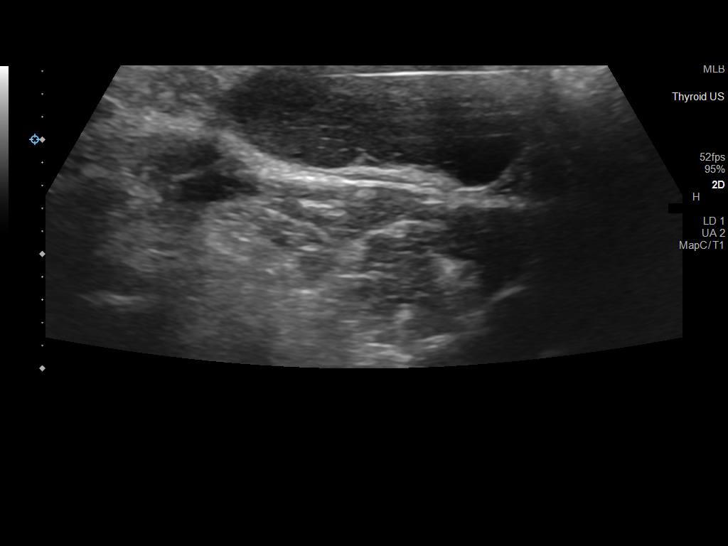
[im 8/12]
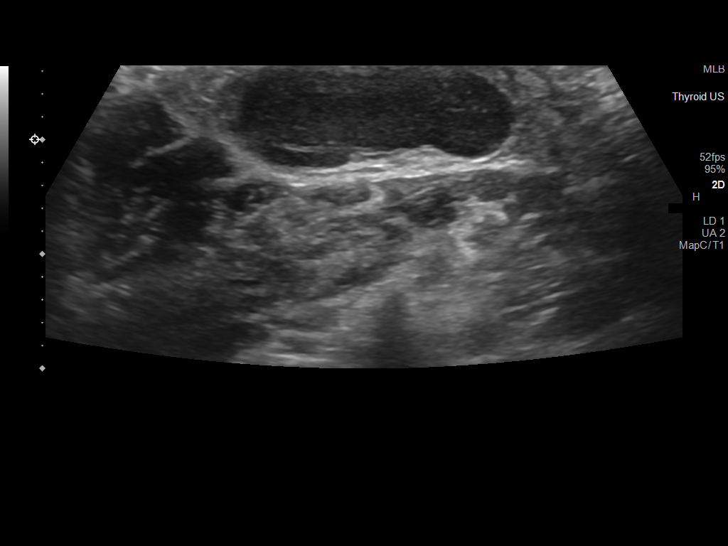
[im 9/12]
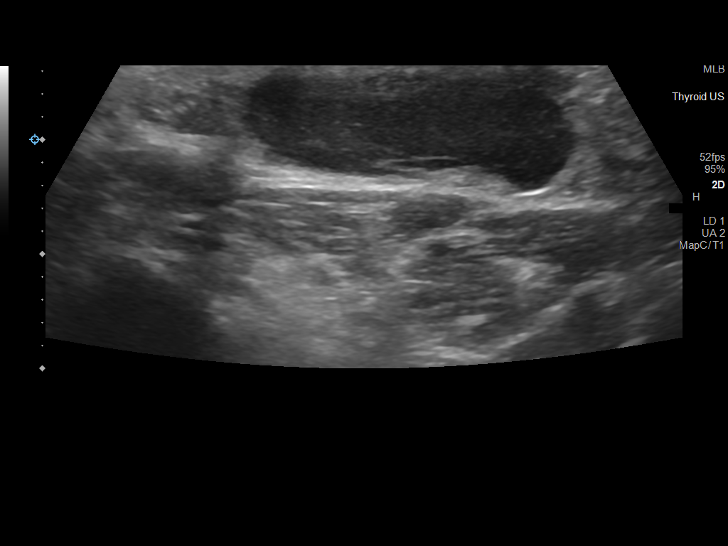
[im 10/12]
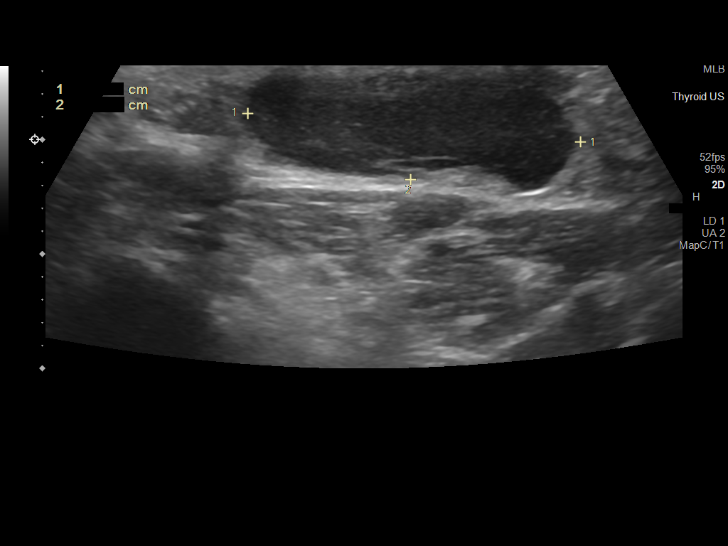
[im 11/12]
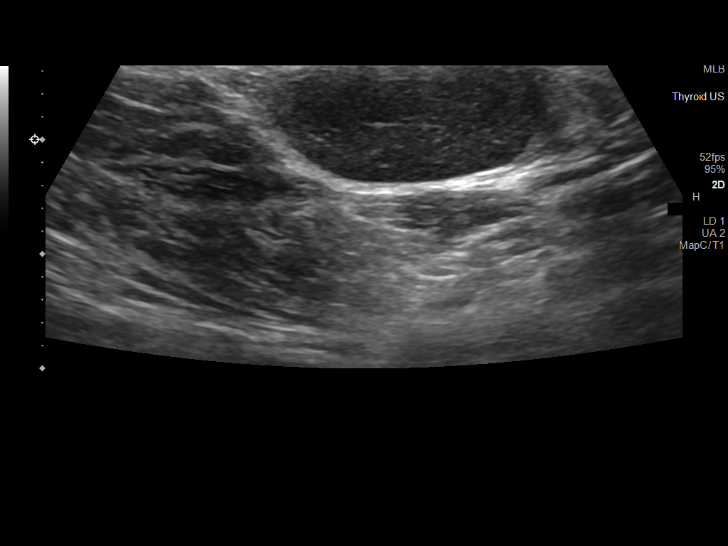
[im 12/12]
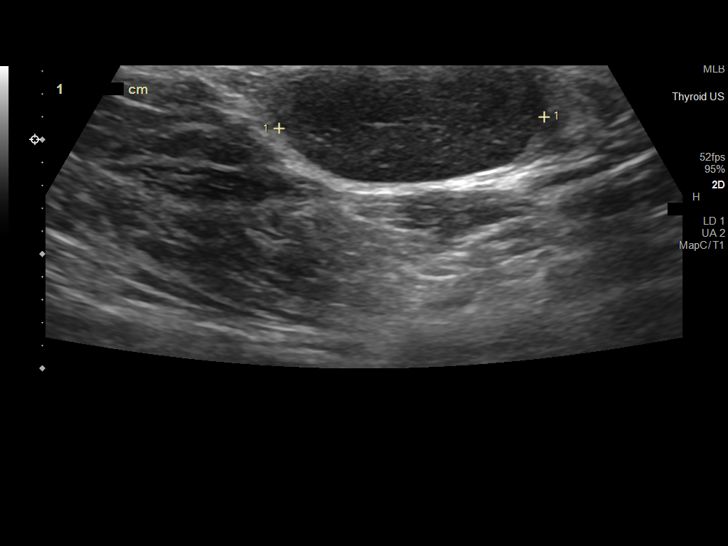

[12 of 12 positions shown; findings below may reference images not displayed]

EXAM:
ULTRASOUND GUIDED CORE BIOPSY OF right cervical

MEDICATIONS:
None.

ANESTHESIA/SEDATION:
Fentanyl 50 mcg IV; Versed 1.0 mg IV

Moderate Sedation Time:  20 minutes.

The patient was continuously monitored during the procedure by the
interventional radiology nurse under my direct supervision.

PROCEDURE:
The procedure, risks, benefits, and alternatives were explained to
the patient. Questions regarding the procedure were encouraged and
answered. The patient understands and consents to the procedure. A
time-out was performed prior to initiating the procedure.

Ultrasound was performed of lymph nodes in the neck bilaterally. The
right neck was prepped with chlorhexidine in a sterile fashion, and
a sterile drape was applied covering the operative field. A sterile
gown and sterile gloves were used for the procedure. Local
anesthesia was provided with 1% Lidocaine.

A 16 gauge core biopsy device was utilized in obtaining 4 core
biopsy samples of an enlarged right cervical lymph node
corresponding to the largest right-sided lymph node seen by prior
neck ultrasound. Core biopsy samples were submitted in saline.
Additional ultrasound was performed.

COMPLICATIONS:
None immediate.
FINDINGS: Enlarged right cervical lymph node measures approximately 2.9 x
x 2.3 cm. Solid core biopsy samples were obtained.
IMPRESSION: Ultrasound-guided core biopsy performed of an enlarged right
cervical lymph node.

## 2022-01-17 ENCOUNTER — Encounter (HOSPITAL_COMMUNITY): Payer: Self-pay

## 2022-01-17 ENCOUNTER — Ambulatory Visit (HOSPITAL_COMMUNITY)
Admission: EM | Admit: 2022-01-17 | Discharge: 2022-01-17 | Disposition: A | Discharge status: Home / Self Care | Payer: Managed Care, Other (non HMO) | Attending: Family Medicine | Admitting: Family Medicine

## 2022-01-17 DIAGNOSIS — J029 Acute pharyngitis, unspecified: Secondary | ICD-10-CM | POA: Diagnosis not present

## 2022-01-17 LAB — POCT RAPID STREP A, ED / UC: Streptococcus, Group A Screen (Direct): POSITIVE — AB

## 2022-01-17 MED ORDER — IBUPROFEN 800 MG PO TABS: 800.0000 mg | ORAL_TABLET | Freq: Three times a day (TID) | ORAL | 0 refills | Status: DC | PRN

## 2022-01-17 MED ORDER — AMOXICILLIN 875 MG PO TABS
875.0000 mg | ORAL_TABLET | Freq: Two times a day (BID) | ORAL | 0 refills | Status: AC
Start: 2022-01-17 — End: 2022-01-27

## 2022-01-17 MED ORDER — KETOROLAC TROMETHAMINE 30 MG/ML IJ SOLN
30.0000 mg | Freq: Once | INTRAMUSCULAR | Status: AC
  Administered 2022-01-17: 30 mg via INTRAMUSCULAR

## 2022-01-17 MED ORDER — KETOROLAC TROMETHAMINE 30 MG/ML IJ SOLN
INTRAMUSCULAR | Status: AC
Start: 2022-01-17 — End: ?
  Filled 2022-01-17: qty 1

## 2022-01-17 NOTE — ED Triage Notes (Signed)
Onset of yesterday Patient feeling hot, ear pain, and throat pain. Patient states I was worse when waking up.   Patient feeling a fullness and sharp pain in the right ear. States when looking in her ear it looked white and swollen.

## 2022-01-17 NOTE — Discharge Instructions (Addendum)
Strep test is positive 2 days into taking the antibiotics, throw away the toothbrush and begin using a new one. Patient is not contagious after 24 hours of antibiotics; a full 10 days should be completed to prevent rheumatic fever  Take amoxicillin 875 mg--1 tab twice daily for 10 days  You have been given a shot of Toradol 30 mg today.  Take ibuprofen 800 mg--1 tab every 8 hours as needed for pain.

## 2022-01-17 NOTE — ED Provider Notes (Signed)
[facility]    CSN: [id number] Arrival date & time: 01/17/22  1553      History   Chief Complaint Chief Complaint  Patient presents with   Fever   Sore Throat   Ear Problem    HPI Sandra Nicholson is a 36 y.o. female.    Fever Sore Throat   Here for sore throat that began yesterday and worsened this morning.  She is also had some right ear pain.  The right ear also feels blocked or clogged.  No fever noted.  No cough or nasal congestion.  No vomiting or diarrhea.  Last menstrual cycle was about June 20  Past Medical History:  Diagnosis Date   Allergy     Patient Active Problem List   Diagnosis Date Noted   Sarcoidosis 06/13/2020   Iron deficiency anemia 06/13/2020    Past Surgical History:  Procedure Laterality Date   LYMPH NODE BIOPSY  03/2020   Per patient   NO PAST SURGERIES     WISDOM TOOTH EXTRACTION  2021   Per patient    OB History   No obstetric history on file.      Home Medications    Prior to Admission medications   Medication Sig Start Date End Date Taking? Authorizing Provider  amoxicillin (AMOXIL) 875 MG tablet Take 1 tablet (875 mg total) by mouth 2 (two) times daily for 10 days. 01/17/22 01/27/22 Yes Young Brim, Janace Aris, MD  acetaminophen (TYLENOL) 500 MG tablet Take 1,000 mg by mouth every 6 (six) hours as needed for headache (pain).    [provider]  Aspirin-Acetaminophen-Caffeine (GOODY HEADACHE PO) Take 1 packet by mouth 2 (two) times daily as needed (pain).    [provider]  budesonide-formoterol (SYMBICORT) 80-4.5 MCG/ACT inhaler Inhale 2 puffs into the lungs in the morning and at bedtime. 07/03/20   Hunsucker, Lesia Sago, MD  chlorhexidine (PERIDEX) 0.12 % solution Use as directed 15 mLs in the mouth or throat 4 (four) times daily. Patient taking differently: Use as directed 15 mLs in the mouth or throat as needed. 03/05/20   Claiborne Rigg, NP  Fluticasone-Salmeterol,sensor, [facility]) [phone]  MCG/ACT AEPB Inhale 1 puff into the lungs daily. 07/11/20   Coral Ceo, NP  iron polysaccharides (NIFEREX) 150 MG capsule TAKE 1 CAPSULE BY MOUTH EVERY DAY 01/09/21   Johney Maine, MD  lidocaine (XYLOCAINE) 2 % solution Use as directed 10 mLs in the mouth or throat at bedtime as needed for mouth pain. 07/03/20   Hunsucker, Lesia Sago, MD  magic mouthwash SOLN Take 5 mLs by mouth 2 (two) times daily as needed for mouth pain. 07/03/20   Hunsucker, Lesia Sago, MD  Multiple Vitamin (MULTIVITAMIN WITH MINERALS) TABS tablet Take 1 tablet by mouth daily.    [provider]    Family History Family History  Problem Relation Age of Onset   Diabetes Father    Hypertension Father    Kidney disease Father     Social History Social History   Tobacco Use   Smoking status: Never   Smokeless tobacco: Never  Vaping Use   Vaping Use: Former  Substance Use Topics   Alcohol use: Yes    Alcohol/week: 0.0 standard drinks of alcohol    Comment: Socially   Drug use: No     Allergies   Fish allergy and Macadamia nut oil   Review of Systems Review of Systems  Constitutional:  Positive for fever.  Physical Exam Triage Vital Signs ED Triage Vitals  Enc Vitals Group     BP 01/17/22 1658 108/73     Pulse Rate 01/17/22 1658 68     Resp 01/17/22 1658 16     Temp 01/17/22 1658 98.8 F (37.1 C)     Temp Source 01/17/22 1658 Oral     SpO2 01/17/22 1658 97 %     Weight 01/17/22 1702 145 lb (65.8 kg)     Height 01/17/22 1702 5\' 7"  (1.702 m)     Head Circumference --      Peak Flow --      Pain Score 01/17/22 1701 6     Pain Loc --      Pain Edu? --      Excl. in GC? --    No data found.  Updated Vital Signs BP 108/73 (BP Location: Left Arm)   Pulse 68   Temp 98.8 F (37.1 C) (Oral)   Resp 16   Ht 5\' 7"  (1.702 m)   Wt 65.8 kg   LMP 01/02/2022 (Within Days)   SpO2 97%   BMI 22.71 kg/m   Visual Acuity Right Eye Distance:   Left Eye Distance:   Bilateral  Distance:    Right Eye Near:   Left Eye Near:    Bilateral Near:     Physical Exam Vitals reviewed.  Constitutional:      General: She is not in acute distress.    Appearance: She is not toxic-appearing.  HENT:     Right Ear: Tympanic membrane and ear canal normal.     Left Ear: Tympanic membrane and ear canal normal.     Nose: Nose normal.     Mouth/Throat:     Mouth: Mucous membranes are moist.     Comments: There is a circular erythematous area with central yellow color on her right soft palate.  There also is some mild erythema of both tonsillar pillars. Eyes:     Extraocular Movements: Extraocular movements intact.     Conjunctiva/sclera: Conjunctivae normal.     Pupils: Pupils are equal, round, and reactive to light.  Cardiovascular:     Rate and Rhythm: Normal rate and regular rhythm.     Heart sounds: No murmur heard. Pulmonary:     Effort: Pulmonary effort is normal. No respiratory distress.     Breath sounds: No stridor. No wheezing, rhonchi or rales.  Chest:     Chest wall: No tenderness.  Musculoskeletal:     Cervical back: Neck supple.  Lymphadenopathy:     Cervical: No cervical adenopathy.  Skin:    Capillary Refill: Capillary refill takes less than 2 seconds.     Coloration: Skin is not jaundiced or pale.  Neurological:     General: No focal deficit present.     Mental Status: She is alert and oriented to person, place, and time.  Psychiatric:        Behavior: Behavior normal.      UC Treatments / Results  Labs (all labs ordered are listed, but only abnormal results are displayed) Labs Reviewed  POCT RAPID STREP A, ED / UC - Abnormal; Notable for the following components:      Result Value   Streptococcus, Group A Screen (Direct) POSITIVE (*)    All other components within normal limits  CULTURE, GROUP A STREP [facility])  POCT RAPID STREP A, ED / UC    EKG   Radiology No results found.  Procedures  Procedures (including critical care  time)  Medications Ordered in UC Medications  ketorolac (TORADOL) 30 MG/ML injection 30 mg (has no administration in time range)    Initial Impression / Assessment and Plan / UC Course  I have reviewed the triage vital signs and the nursing notes.  Pertinent labs & imaging results that were available during my care of the patient were reviewed by me and considered in my medical decision making (see chart for details).     Rapid strep is positive so I will treat with amoxicillin   We will give her a Toradol shot and I will send in ibuprofen for pain Final Clinical Impressions(s) / UC Diagnoses   Final diagnoses:  Acute pharyngitis, unspecified etiology     Discharge Instructions      Strep test is positive 2 days into taking the antibiotics, throw away the toothbrush and begin using a new one. Patient is not contagious after 24 hours of antibiotics; a full 10 days should be completed to prevent rheumatic fever  Take amoxicillin 875 mg--1 tab twice daily for 10 days  You have been given a shot of Toradol 30 mg today.  Take ibuprofen 800 mg--1 tab every 8 hours as needed for pain.       ED Prescriptions     Medication Sig Dispense Auth. Provider   amoxicillin (AMOXIL) 875 MG tablet Take 1 tablet (875 mg total) by mouth 2 (two) times daily for 10 days. 20 tablet Cara Thaxton, Janace Aris, MD      PDMP not reviewed this encounter.   Zenia Resides, MD 01/17/22 [phone]

## 2022-01-20 LAB — POCT RAPID STREP A, ED / UC: Streptococcus, Group A Screen (Direct): NEGATIVE

## 2022-10-22 ENCOUNTER — Ambulatory Visit (HOSPITAL_COMMUNITY)
Admission: EM | Admit: 2022-10-22 | Discharge: 2022-10-22 | Disposition: A | Discharge status: Home / Self Care | Payer: BLUE CROSS/BLUE SHIELD | Attending: Emergency Medicine | Admitting: Emergency Medicine

## 2022-10-22 ENCOUNTER — Encounter (HOSPITAL_COMMUNITY): Payer: Self-pay

## 2022-10-22 DIAGNOSIS — J309 Allergic rhinitis, unspecified: Secondary | ICD-10-CM | POA: Diagnosis not present

## 2022-10-22 DIAGNOSIS — J069 Acute upper respiratory infection, unspecified: Secondary | ICD-10-CM | POA: Diagnosis not present

## 2022-10-22 LAB — POCT RAPID STREP A, ED / UC: Streptococcus, Group A Screen (Direct): NEGATIVE

## 2022-10-22 MED ORDER — ACETAMINOPHEN 500 MG PO TABS: 500.0000 mg | ORAL_TABLET | Freq: Four times a day (QID) | ORAL | 0 refills | Status: DC | PRN

## 2022-10-22 MED ORDER — ONDANSETRON HCL 4 MG PO TABS: 4.0000 mg | ORAL_TABLET | Freq: Four times a day (QID) | ORAL | 0 refills | Status: DC

## 2022-10-22 MED ORDER — ACETAMINOPHEN 325 MG PO TABS
ORAL_TABLET | ORAL | Status: AC
  Filled 2022-10-22: qty 3

## 2022-10-22 MED ORDER — IBUPROFEN 600 MG PO TABS: 600.0000 mg | ORAL_TABLET | Freq: Four times a day (QID) | ORAL | 0 refills | Status: DC | PRN

## 2022-10-22 MED ORDER — ACETAMINOPHEN 325 MG PO TABS
975.0000 mg | ORAL_TABLET | Freq: Once | ORAL | Status: AC
  Administered 2022-10-22: 975 mg via ORAL

## 2022-10-22 NOTE — Discharge Instructions (Addendum)
Your strep testing was negative today in clinic. Your symptoms are consistent with a viral upper respiratory infection, please alternate between tylenol and ibuprofen every 4-6 hours as needed for fever, chills, sore throat and discomfort.  For your sore throat you can do warm saline gargles, over-the-counter cough medication, tea with warm honey and sleeping with a humidifier. You can take the nausea medication as needed.   I believe that your sneezing is due to allergic symptoms.  Please continue to take a daily antihistamine.  He can also do the Flonase spray and take 1200 mg of Mucinex daily.  Please ensure you are drinking at least 64 ounces of water to help loosen up your secretions.  Your ear exam was normal, you do not have signs of an ear infection.  I believe that the pain and pressure you are feeling is due to the nasal congestion do from your upper respiratory infection and allergic symptoms.  If your symptoms do not improve over the next week, please return to the clinic or follow-up with your primary care provider for further evaluation.  Please seek immediate care if you develop shortness of breath, chest pain, or any worsening of symptoms.

## 2022-10-22 NOTE — ED Provider Notes (Signed)
[facility]    CSN: [id number] Arrival date & time: 10/22/22  1514      History   Chief Complaint Chief Complaint  Patient presents with   Sore Throat   Otalgia   Nasal Congestion   Generalized Body Aches    HPI Sandra Nicholson is a 37 y.o. female.   Patient presents to clinic for evaluation of sore throat, ear pain, nasal congestion, body aches and sneezing.  Reports Sunday she went to a concert. After she ate food prior to arrival at the concert (which was outdoors) she noticed she had a HA w/ nausea, thought initially she had food poisoning because she felt like it was out of nowhere. The next day she had multiple episodes of sneezing, nasal congestion. She took Claritin, cold and flu meds on Tuesday because she had sore throat, chills, body aches. She tried mucinex after this.  Reports she was doing a little better, but then more and more symptoms developed.   Reports chills.  Denies cough, chest pain or shortness of breath.  Has been taking Goody's powder but has not taken any Tylenol or ibuprofen specifically.  The history is provided by the patient and medical records.  Sore Throat Associated symptoms include headaches. Pertinent negatives include no chest pain, no abdominal pain and no shortness of breath.  Otalgia Associated symptoms: congestion, headaches, rhinorrhea and sore throat   Associated symptoms: no abdominal pain, no cough and no fever     Past Medical History:  Diagnosis Date   Allergy     Patient Active Problem List   Diagnosis Date Noted   Sarcoidosis 06/13/2020   Iron deficiency anemia 06/13/2020    Past Surgical History:  Procedure Laterality Date   LYMPH NODE BIOPSY  03/2020   Per patient   NO PAST SURGERIES     WISDOM TOOTH EXTRACTION  2021   Per patient    OB History   No obstetric history on file.      Home Medications    Prior to Admission medications   Medication Sig Start Date End Date Taking? Authorizing  Provider  acetaminophen (TYLENOL) 500 MG tablet Take 1 tablet (500 mg total) by mouth every 6 (six) hours as needed. 10/22/22  Yes Rinaldo RatelGarrison, [place] N, FNP  ibuprofen (ADVIL) 600 MG tablet Take 1 tablet (600 mg total) by mouth every 6 (six) hours as needed. 10/22/22  Yes Rinaldo RatelGarrison, [place] N, FNP  ondansetron (ZOFRAN) 4 MG tablet Take 1 tablet (4 mg total) by mouth every 6 (six) hours. 10/22/22  Yes Rinaldo RatelGarrison, [place] N, FNP  Aspirin-Acetaminophen-Caffeine (GOODY HEADACHE PO) Take 1 packet by mouth 2 (two) times daily as needed (pain).    [provider]  chlorhexidine (PERIDEX) 0.12 % solution Use as directed 15 mLs in the mouth or throat 4 (four) times daily. Patient taking differently: Use as directed 15 mLs in the mouth or throat as needed. 03/05/20   Claiborne RiggFleming, Zelda W, NP  Fluticasone-Salmeterol,sensor, [facility](AIRDUO DIGIHALER) [phone] MCG/ACT AEPB Inhale 1 puff into the lungs daily. 07/11/20   Coral CeoMack, Brian P, NP  lidocaine (XYLOCAINE) 2 % solution Use as directed 10 mLs in the mouth or throat at bedtime as needed for mouth pain. 07/03/20   Hunsucker, Lesia SagoMatthew R, MD  magic mouthwash SOLN Take 5 mLs by mouth 2 (two) times daily as needed for mouth pain. 07/03/20   Hunsucker, Lesia SagoMatthew R, MD  Multiple Vitamin (MULTIVITAMIN WITH MINERALS) TABS tablet Take 1 tablet by mouth daily.  [provider]    Family History Family History  Problem Relation Age of Onset   Diabetes Father    Hypertension Father    Kidney disease Father     Social History Social History   Tobacco Use   Smoking status: Never   Smokeless tobacco: Never  Vaping Use   Vaping Use: Former  Substance Use Topics   Alcohol use: Yes    Alcohol/week: 0.0 standard drinks of alcohol    Comment: Socially   Drug use: No     Allergies   Fish allergy and Macadamia nut oil   Review of Systems Review of Systems  Constitutional:  Positive for chills. Negative for fever.  HENT:  Positive for congestion, ear pain,  rhinorrhea, sneezing and sore throat.   Respiratory:  Negative for cough and shortness of breath.   Cardiovascular:  Negative for chest pain.  Gastrointestinal:  Positive for nausea. Negative for abdominal pain.  Genitourinary:  Negative for dysuria.  Neurological:  Positive for headaches.     Physical Exam Triage Vital Signs ED Triage Vitals [10/22/22 1646]  Enc Vitals Group     BP 112/80     Pulse Rate 74     Resp 14     Temp 99.4 F (37.4 C)     Temp Source Oral     SpO2 98 %     Weight      Height      Head Circumference      Peak Flow      Pain Score      Pain Loc      Pain Edu?      Excl. in GC?    No data found.  Updated Vital Signs BP 112/80 (BP Location: Left Arm)   Pulse 74   Temp 99.4 F (37.4 C) (Oral)   Resp 14   LMP 10/19/2022   SpO2 98%   Visual Acuity Right Eye Distance:   Left Eye Distance:   Bilateral Distance:    Right Eye Near:   Left Eye Near:    Bilateral Near:     Physical Exam Vitals and nursing note reviewed.  Constitutional:      General: She is not in acute distress.    Appearance: She is well-developed.  HENT:     Head: Normocephalic and atraumatic.     Right Ear: Tympanic membrane and ear canal normal.     Left Ear: Tympanic membrane and ear canal normal.     Nose: Congestion and rhinorrhea present.     Mouth/Throat:     Mouth: Mucous membranes are moist.     Pharynx: Posterior oropharyngeal erythema present.     Tonsils: No tonsillar exudate or tonsillar abscesses. 2+ on the right. 2+ on the left.  Eyes:     Conjunctiva/sclera: Conjunctivae normal.     Pupils: Pupils are equal, round, and reactive to light.  Cardiovascular:     Rate and Rhythm: Normal rate and regular rhythm.     Heart sounds: Normal heart sounds. No murmur heard. Pulmonary:     Effort: Pulmonary effort is normal. No respiratory distress.     Breath sounds: Normal breath sounds.  Musculoskeletal:        General: No swelling.     Cervical back:  Normal range of motion and neck supple.  Lymphadenopathy:     Cervical: Cervical adenopathy present.  Skin:    General: Skin is warm and dry.     Capillary Refill:  Capillary refill takes less than 2 seconds.  Neurological:     Mental Status: She is alert.  Psychiatric:        Mood and Affect: Mood normal.      UC Treatments / Results  Labs (all labs ordered are listed, but only abnormal results are displayed) Labs Reviewed  POCT RAPID STREP A, ED / UC    EKG   Radiology No results found.  Procedures Procedures (including critical care time)  Medications Ordered in UC Medications  acetaminophen (TYLENOL) tablet 975 mg (975 mg Oral Given 10/22/22 1731)    Initial Impression / Assessment and Plan / UC Course  I have reviewed the triage vital signs and the nursing notes.  Pertinent labs & imaging results that were available during my care of the patient were reviewed by me and considered in my medical decision making (see chart for details).  Vitals in triage reviewed, patient is hemodynamically stable.  Lungs vesicular posteriorly, tonsils with erythema, without exudate or white patches bilaterally.  Patient requested strep testing, rapid strep negative in clinic.  Discussed that this is likely a viral upper respiratory infection, patient declined COVID and flu testing.  Will treat symptomatically with Tylenol, ibuprofen and other symptomatic measures.  Advised return to clinic if no improvement over the next 7 to 10 days.  Encouraged to continue on daily antihistamine as well as Flonase and Mucinex for allergic symptoms.  Reassured that ears are without infection, encouraged to stop using the wax removal tool as she is caused skin breakdown in her your canal and there is no more wax.  Advised to take Tylenol and ibuprofen for discomfort.  Patient verbalized understanding, no questions at this time.  Return and follow-up precautions reviewed.    Final Clinical Impressions(s) /  UC Diagnoses   Final diagnoses:  Viral upper respiratory tract infection  Allergic rhinitis, unspecified seasonality, unspecified trigger     Discharge Instructions      Your strep testing was negative today in clinic. Your symptoms are consistent with a viral upper respiratory infection, please alternate between tylenol and ibuprofen every 4-6 hours as needed for fever, chills, sore throat and discomfort.  For your sore throat you can do warm saline gargles, over-the-counter cough medication, tea with warm honey and sleeping with a humidifier. You can take the nausea medication as needed.   I believe that your sneezing is due to allergic symptoms.  Please continue to take a daily antihistamine.  He can also do the Flonase spray and take 1200 mg of Mucinex daily.  Please ensure you are drinking at least 64 ounces of water to help loosen up your secretions.  Your ear exam was normal, you do not have signs of an ear infection.  I believe that the pain and pressure you are feeling is due to the nasal congestion do from your upper respiratory infection and allergic symptoms.  If your symptoms do not improve over the next week, please return to the clinic or follow-up with your primary care provider for further evaluation.  Please seek immediate care if you develop shortness of breath, chest pain, or any worsening of symptoms.      ED Prescriptions     Medication Sig Dispense Auth. Provider   acetaminophen (TYLENOL) 500 MG tablet Take 1 tablet (500 mg total) by mouth every 6 (six) hours as needed. 30 tablet Rinaldo Ratel, [place] N, [place]   ibuprofen (ADVIL) 600 MG tablet Take 1 tablet (600 mg total) by mouth  every 6 (six) hours as needed. 30 tablet Rinaldo Ratel, [place] N, FNP   ondansetron (ZOFRAN) 4 MG tablet Take 1 tablet (4 mg total) by mouth every 6 (six) hours. 12 tablet Darrell Hauk, [place] N, [place]      PDMP not reviewed this encounter.   Lawan Nanez, [place] N, [place] 10/22/22 [id number]

## 2022-10-22 NOTE — ED Triage Notes (Signed)
Patient reports that  she has had a sore throat, headache, bilateral ear pain R>L, nasal congestion, sneezing, generalized body aches x 3 days.  Patient states that she has taken Claritin, cold flu, Goody Powder. Patient states she took a [employer] allergy medication today.

## 2022-12-27 ENCOUNTER — Ambulatory Visit (HOSPITAL_COMMUNITY)
Admission: EM | Admit: 2022-12-27 | Discharge: 2022-12-27 | Disposition: A | Discharge status: Home / Self Care | Payer: BLUE CROSS/BLUE SHIELD | Attending: Emergency Medicine | Admitting: Emergency Medicine

## 2022-12-27 ENCOUNTER — Ambulatory Visit (HOSPITAL_COMMUNITY): Payer: Managed Care, Other (non HMO)

## 2022-12-27 ENCOUNTER — Encounter (HOSPITAL_COMMUNITY): Payer: Self-pay

## 2022-12-27 DIAGNOSIS — K0889 Other specified disorders of teeth and supporting structures: Secondary | ICD-10-CM

## 2022-12-27 DIAGNOSIS — K0381 Cracked tooth: Secondary | ICD-10-CM

## 2022-12-27 MED ORDER — AMOXICILLIN 500 MG PO CAPS
500.0000 mg | ORAL_CAPSULE | Freq: Two times a day (BID) | ORAL | 0 refills | Status: AC
Start: 2022-12-27 — End: 2023-01-01

## 2022-12-27 MED ORDER — IBUPROFEN 800 MG PO TABS: 800.0000 mg | ORAL_TABLET | Freq: Three times a day (TID) | ORAL | 0 refills | Status: DC

## 2022-12-27 NOTE — Discharge Instructions (Addendum)
Please take antibiotic as prescribed. Take with food to avoid upset stomach. You can take ibuprofen 800 mg every 6 hours for pain control Continue orajel as needed Follow up with dentist as soon as able

## 2022-12-27 NOTE — ED Triage Notes (Signed)
Pt is here for dental pain x  2 weeks.

## 2022-12-28 NOTE — ED Provider Notes (Signed)
[facility]    CSN: [id number] Arrival date & time: 12/27/22  1702      History   Chief Complaint Chief Complaint  Patient presents with   Dental Pain    HPI Sandra Nicholson is a 37 y.o. female.  2 weeks of dental pain. She cracked her back molar and the area has been aching. 1 week ago she started having pain in the tooth just in front of the cracked molar.  She had seen her dentist who thinks her wisdom teeth are coming in. They had also filled a cavity somewhere in her area of pain.  Was using orajel with minimal relief No fever. No trouble swallowing   Past Medical History:  Diagnosis Date   Allergy     Patient Active Problem List   Diagnosis Date Noted   Sarcoidosis 06/13/2020   Iron deficiency anemia 06/13/2020    Past Surgical History:  Procedure Laterality Date   LYMPH NODE BIOPSY  03/2020   Per patient   NO PAST SURGERIES     WISDOM TOOTH EXTRACTION  2021   Per patient    OB History   No obstetric history on file.      Home Medications    Prior to Admission medications   Medication Sig Start Date End Date Taking? Authorizing Provider  amoxicillin (AMOXIL) 500 MG capsule Take 1 capsule (500 mg total) by mouth every 12 (twelve) hours for 5 days. 12/27/22 01/01/23 Yes Jabaree Mercado, Lurena Joiner, PA-C  ibuprofen (ADVIL) 800 MG tablet Take 1 tablet (800 mg total) by mouth 3 (three) times daily. 12/27/22  Yes Sheilia Reznick, Lurena Joiner, PA-C  acetaminophen (TYLENOL) 500 MG tablet Take 1 tablet (500 mg total) by mouth every 6 (six) hours as needed. 10/22/22   Garrison, [place] N, FNP  Aspirin-Acetaminophen-Caffeine (GOODY HEADACHE PO) Take 1 packet by mouth 2 (two) times daily as needed (pain).    [provider]  chlorhexidine (PERIDEX) 0.12 % solution Use as directed 15 mLs in the mouth or throat 4 (four) times daily. Patient taking differently: Use as directed 15 mLs in the mouth or throat as needed. 03/05/20   Claiborne Rigg, NP   Fluticasone-Salmeterol,sensor, [facility]) [id number] MCG/ACT AEPB Inhale 1 puff into the lungs daily. 07/11/20   Coral Ceo, NP  lidocaine (XYLOCAINE) 2 % solution Use as directed 10 mLs in the mouth or throat at bedtime as needed for mouth pain. 07/03/20   Hunsucker, Lesia Sago, MD  magic mouthwash SOLN Take 5 mLs by mouth 2 (two) times daily as needed for mouth pain. 07/03/20   Hunsucker, Lesia Sago, MD  Multiple Vitamin (MULTIVITAMIN WITH MINERALS) TABS tablet Take 1 tablet by mouth daily.    [provider]  ondansetron (ZOFRAN) 4 MG tablet Take 1 tablet (4 mg total) by mouth every 6 (six) hours. 10/22/22   Garrison, [place] N, FNP    Family History Family History  Problem Relation Age of Onset   Diabetes Father    Hypertension Father    Kidney disease Father     Social History Social History   Tobacco Use   Smoking status: Never   Smokeless tobacco: Never  Vaping Use   Vaping Use: Former  Substance Use Topics   Alcohol use: Yes    Alcohol/week: 0.0 standard drinks of alcohol    Comment: Socially   Drug use: No     Allergies   Fish allergy and Macadamia nut oil   Review of Systems Review of  Systems As per HPI  Physical Exam Triage Vital Signs ED Triage Vitals [12/27/22 1725]  Enc Vitals Group     BP (!) 109/90     Pulse Rate 80     Resp 18     Temp 98.6 F (37 C)     Temp Source Oral     SpO2 98 %     Weight      Height      Head Circumference      Peak Flow      Pain Score      Pain Loc      Pain Edu?      Excl. in GC?    No data found.  Updated Vital Signs BP (!) 109/90 (BP Location: Left Arm)   Pulse 80   Temp 98.6 F (37 C) (Oral)   Resp 18   LMP 12/11/2022 (Approximate)   SpO2 98%   Physical Exam Vitals and nursing note reviewed.  Constitutional:      General: She is not in acute distress.    Appearance: She is not ill-appearing.  HENT:     Nose: No rhinorrhea.     Mouth/Throat:     Mouth: Mucous membranes are moist.      Dentition: Abnormal dentition. Dental tenderness, gingival swelling and dental caries present. No gum lesions.     Pharynx: Oropharynx is clear. No posterior oropharyngeal erythema.      Comments: Broken tooth left back molar. The tooth in front is tender with palpation. There is minimal swelling around the gums. Dentition abnormal throughout  Eyes:     Conjunctiva/sclera: Conjunctivae normal.  Cardiovascular:     Rate and Rhythm: Normal rate and regular rhythm.  Pulmonary:     Effort: Pulmonary effort is normal.  Musculoskeletal:     Cervical back: Normal range of motion. No rigidity or tenderness.  Skin:    General: Skin is warm and dry.  Neurological:     Mental Status: She is alert and oriented to person, place, and time.      UC Treatments / Results  Labs (all labs ordered are listed, but only abnormal results are displayed) Labs Reviewed - No data to display  EKG   Radiology No results found.  Procedures Procedures (including critical care time)  Medications Ordered in UC Medications - No data to display  Initial Impression / Assessment and Plan / UC Course  I have reviewed the triage vital signs and the nursing notes.  Pertinent labs & imaging results that were available during my care of the patient were reviewed by me and considered in my medical decision making (see chart for details).  Broken tooth and dental tenderness. Cover for infection with amox BID x 5 days. Continue orajel. Will try ibuprofen 800 mg q6 hours for pain. Advised to contact dentist and follow up as soon as possible. Return and ED precautions discussed  Final Clinical Impressions(s) / UC Diagnoses   Final diagnoses:  Pain, dental  Nontraumatic broken or cracked tooth     Discharge Instructions      Please take antibiotic as prescribed. Take with food to avoid upset stomach. You can take ibuprofen 800 mg every 6 hours for pain control Continue orajel as needed Follow up with  dentist as soon as able    ED Prescriptions     Medication Sig Dispense Auth. Provider   ibuprofen (ADVIL) 800 MG tablet Take 1 tablet (800 mg total) by mouth 3 (three) times  daily. 21 tablet Doha Boling, PA-C   amoxicillin (AMOXIL) 500 MG capsule Take 1 capsule (500 mg total) by mouth every 12 (twelve) hours for 5 days. 10 capsule Freeland Pracht, Lurena Joiner, PA-C      PDMP not reviewed this encounter.   Justyne Roell, Lurena Joiner, [place] 12/28/22 1032

## 2023-06-17 ENCOUNTER — Ambulatory Visit (INDEPENDENT_AMBULATORY_CARE_PROVIDER_SITE_OTHER): Payer: BLUE CROSS/BLUE SHIELD

## 2023-06-17 ENCOUNTER — Ambulatory Visit (HOSPITAL_COMMUNITY)
Admission: EM | Admit: 2023-06-17 | Discharge: 2023-06-17 | Disposition: A | Discharge status: Home / Self Care | Payer: BLUE CROSS/BLUE SHIELD | Attending: Physician Assistant | Admitting: Physician Assistant

## 2023-06-17 ENCOUNTER — Encounter (HOSPITAL_COMMUNITY): Payer: Self-pay

## 2023-06-17 DIAGNOSIS — J329 Chronic sinusitis, unspecified: Secondary | ICD-10-CM

## 2023-06-17 DIAGNOSIS — J40 Bronchitis, not specified as acute or chronic: Secondary | ICD-10-CM | POA: Diagnosis not present

## 2023-06-17 MED ORDER — PREDNISONE 20 MG PO TABS: 40.0000 mg | ORAL_TABLET | Freq: Every day | ORAL | 0 refills | Status: AC

## 2023-06-17 MED ORDER — DOXYCYCLINE HYCLATE 100 MG PO CAPS: 100.0000 mg | ORAL_CAPSULE | Freq: Two times a day (BID) | ORAL | 0 refills | Status: DC

## 2023-06-17 MED ORDER — ALBUTEROL SULFATE HFA 108 (90 BASE) MCG/ACT IN AERS
INHALATION_SPRAY | RESPIRATORY_TRACT | Status: AC
  Filled 2023-06-17: qty 6.7

## 2023-06-17 MED ORDER — PROMETHAZINE-DM 6.25-15 MG/5ML PO SYRP: 5.0000 mL | ORAL_SOLUTION | Freq: Two times a day (BID) | ORAL | 0 refills | Status: DC | PRN

## 2023-06-17 MED ORDER — ALBUTEROL SULFATE HFA 108 (90 BASE) MCG/ACT IN AERS
2.0000 | INHALATION_SPRAY | Freq: Once | RESPIRATORY_TRACT | Status: AC
  Administered 2023-06-17: 2 via RESPIRATORY_TRACT

## 2023-06-17 NOTE — ED Triage Notes (Signed)
Patient here today with c/o productive cough, chest congestion, ST, bilat ear pain, nasal congestion, body aches, chills, and headache since Friday. Symptoms worsening since yesterday. She has been taking Mucinex and a cough syrup with no relief. Her sister has also had cold symptoms.

## 2023-06-17 NOTE — Discharge Instructions (Signed)
I am concerned that you have a sinobronchitis.  I will contact you if your x-ray is abnormal and changes our treatment plan.  Start doxycycline 100 mg twice daily for 10 days.  Stay out of the sun on this medication as it can cause you to have a sunburn.  Start prednisone 40 mg for 5 days.  Do not take NSAIDs with this medication due to risk of GI bleeding.  This includes aspirin, ibuprofen/Advil, naproxen/Aleve.  You can use Tylenol/acetaminophen.  Take Promethazine DM for cough.  This make you sleepy so do not drive or drink alcohol taking it.  Use the albuterol every 4-6 hours as needed.  If your symptoms or not improving within a few days please return for reevaluation.  If anything worsens and you have worsening cough, shortness of breath, chest pain, nausea, vomiting you need to be seen immediately.

## 2023-06-17 NOTE — ED Provider Notes (Signed)
[facility]    CSN: [id number] Arrival date & time: 06/17/23  1026      History   Chief Complaint Chief Complaint  Patient presents with   Cough    HPI Sandra Nicholson is a 37 y.o. female.   Patient presents today with a 6 to 7-day history of URI symptoms.  She reports cough, chest congestion, ear pain, body aches, headache, chills.  She denies any chest pain but does have some shortness of breath and chest tightness.  She has not had a fever.  She does report family contacts that have been sick with similar symptoms.  She has been taking Mucinex, cough syrup, Tylenol without improvement of symptoms.  She denies any history of allergies, asthma, COPD, smoking.  She denies any recent antibiotics or steroids.  She has no concern for pregnancy.  She has never had COVID.  She has not had COVID-19 vaccination.  She has been working despite her symptoms and has been feeling worse over the past several days prompting evaluation today.    Past Medical History:  Diagnosis Date   Allergy     Patient Active Problem List   Diagnosis Date Noted   Sarcoidosis 06/13/2020   Iron deficiency anemia 06/13/2020    Past Surgical History:  Procedure Laterality Date   LYMPH NODE BIOPSY  03/2020   Per patient   NO PAST SURGERIES     WISDOM TOOTH EXTRACTION  2021   Per patient    OB History   No obstetric history on file.      Home Medications    Prior to Admission medications   Medication Sig Start Date End Date Taking? Authorizing Provider  doxycycline (VIBRAMYCIN) 100 MG capsule Take 1 capsule (100 mg total) by mouth 2 (two) times daily. 06/17/23  Yes Deklin Bieler K, PA-C  predniSONE (DELTASONE) 20 MG tablet Take 2 tablets (40 mg total) by mouth daily for 5 days. 06/17/23 06/22/23 Yes Obadiah Dennard K, PA-C  promethazine-dextromethorphan (PROMETHAZINE-DM) 6.25-15 MG/5ML syrup Take 5 mLs by mouth 2 (two) times daily as needed for cough. 06/17/23  Yes Amelia Burgard, Noberto Retort, PA-C   acetaminophen (TYLENOL) 500 MG tablet Take 1 tablet (500 mg total) by mouth every 6 (six) hours as needed. 10/22/22   Garrison, [place] N, FNP  Aspirin-Acetaminophen-Caffeine (GOODY HEADACHE PO) Take 1 packet by mouth 2 (two) times daily as needed (pain).    [provider]    Family History Family History  Problem Relation Age of Onset   Diabetes Father    Hypertension Father    Kidney disease Father     Social History Social History   Tobacco Use   Smoking status: Never   Smokeless tobacco: Never  Vaping Use   Vaping status: Former  Substance Use Topics   Alcohol use: Not Currently    Comment: Socially   Drug use: No     Allergies   Fish allergy and Macadamia nut oil   Review of Systems Review of Systems  Constitutional:  Positive for activity change and fatigue. Negative for appetite change and fever.  HENT:  Positive for congestion, ear pain and sore throat. Negative for sinus pressure and sneezing.   Respiratory:  Positive for cough, chest tightness and shortness of breath. Negative for wheezing.   Cardiovascular:  Negative for chest pain.  Gastrointestinal:  Negative for abdominal pain, diarrhea, nausea and vomiting.  Neurological:  Positive for headaches. Negative for dizziness and light-headedness.     Physical  Exam Triage Vital Signs ED Triage Vitals  Encounter Vitals Group     BP 06/17/23 1110 117/74     Systolic BP Percentile --      Diastolic BP Percentile --      Pulse Rate 06/17/23 1110 94     Resp 06/17/23 1110 16     Temp 06/17/23 1110 99 F (37.2 C)     Temp Source 06/17/23 1110 Oral     SpO2 06/17/23 1110 97 %     Weight 06/17/23 1109 150 lb (68 kg)     Height 06/17/23 1109 5\' 7"  (1.702 m)     Head Circumference --      Peak Flow --      Pain Score 06/17/23 1105 7     Pain Loc --      Pain Education --      Exclude from Growth Chart --    No data found.  Updated Vital Signs BP 117/74 (BP Location: Right Arm)   Pulse 94    Temp 99 F (37.2 C) (Oral)   Resp 16   Ht 5\' 7"  (1.702 m)   Wt 150 lb (68 kg)   LMP 06/08/2023 (Approximate)   SpO2 97%   BMI 23.49 kg/m   Visual Acuity Right Eye Distance:   Left Eye Distance:   Bilateral Distance:    Right Eye Near:   Left Eye Near:    Bilateral Near:     Physical Exam Vitals reviewed.  Constitutional:      General: She is awake. She is not in acute distress.    Appearance: Normal appearance. She is well-developed. She is not ill-appearing.     Comments: Very female appears stated age in no acute distress sitting comfortably in exam room  HENT:     Head: Normocephalic and atraumatic.     Right Ear: Tympanic membrane, ear canal and external ear normal. Tympanic membrane is not erythematous or bulging.     Left Ear: Tympanic membrane, ear canal and external ear normal. Tympanic membrane is not erythematous or bulging.     Nose:     Right Sinus: No maxillary sinus tenderness or frontal sinus tenderness.     Left Sinus: No maxillary sinus tenderness or frontal sinus tenderness.     Mouth/Throat:     Pharynx: Uvula midline. No oropharyngeal exudate or posterior oropharyngeal erythema.  Cardiovascular:     Rate and Rhythm: Normal rate and regular rhythm.     Heart sounds: Normal heart sounds, S1 normal and S2 normal. No murmur heard. Pulmonary:     Effort: Pulmonary effort is normal.     Breath sounds: Examination of the right-lower field reveals decreased breath sounds. Examination of the left-lower field reveals decreased breath sounds. Decreased breath sounds present. No wheezing, rhonchi or rales.  Psychiatric:        Behavior: Behavior is cooperative.      UC Treatments / Results  Labs (all labs ordered are listed, but only abnormal results are displayed) Labs Reviewed - No data to display  EKG   Radiology No results found.  Procedures Procedures (including critical care time)  Medications Ordered in UC Medications  albuterol (VENTOLIN  HFA) 108 (90 Base) MCG/ACT inhaler 2 puff (2 puffs Inhalation Given 06/17/23 1133)    Initial Impression / Assessment and Plan / UC Course  I have reviewed the triage vital signs and the nursing notes.  Pertinent labs & imaging results that were available during my care  of the patient were reviewed by me and considered in my medical decision making (see chart for details).     Patient is well-appearing, afebrile, nontoxic, nontachycardic.  She did have decreased aeration of bilateral bases and was given albuterol inhaler with improvement of symptoms.  She was sent home with this medication to use it every 4-6 hours as needed.  Given her worsening symptoms including cough chest x-ray was obtained that showed peribronchial thickening based on my primary read but did not show any evidence of pneumonia.  At the time of discharge we were waiting for radiologist over read.  Will treat for sinobronchitis.  She was started on doxycycline to cover for both sinuses and atypical pneumonia which is prevalent in the community.  We discussed that she should avoid prolonged sun exposure while on this medication due to associated photosensitivity.  She was on a prednisone burst, discussed that she is not to take NSAIDs with this medicine.  She was given Promethazine DM for cough.  We discussed that this can be sedating and she is not to drive or drink alcohol with taking it.  Recommended that she rest and drink plenty of fluid.  If her symptoms are not improving within a few days she is to return for reevaluation.  If she has any worsening symptoms she needs to be seen immediately.  Strict return precautions given.  Work excuse note provided.  Final Clinical Impressions(s) / UC Diagnoses   Final diagnoses:  Sinobronchitis     Discharge Instructions      I am concerned that you have a sinobronchitis.  I will contact you if your x-ray is abnormal and changes our treatment plan.  Start doxycycline 100 mg twice  daily for 10 days.  Stay out of the sun on this medication as it can cause you to have a sunburn.  Start prednisone 40 mg for 5 days.  Do not take NSAIDs with this medication due to risk of GI bleeding.  This includes aspirin, ibuprofen/Advil, naproxen/Aleve.  You can use Tylenol/acetaminophen.  Take Promethazine DM for cough.  This make you sleepy so do not drive or drink alcohol taking it.  Use the albuterol every 4-6 hours as needed.  If your symptoms or not improving within a few days please return for reevaluation.  If anything worsens and you have worsening cough, shortness of breath, chest pain, nausea, vomiting you need to be seen immediately.     ED Prescriptions     Medication Sig Dispense Auth. Provider   predniSONE (DELTASONE) 20 MG tablet Take 2 tablets (40 mg total) by mouth daily for 5 days. 10 tablet Kamaal Cast K, PA-C   doxycycline (VIBRAMYCIN) 100 MG capsule Take 1 capsule (100 mg total) by mouth 2 (two) times daily. 20 capsule Troi Bechtold K, PA-C   promethazine-dextromethorphan (PROMETHAZINE-DM) 6.25-15 MG/5ML syrup Take 5 mLs by mouth 2 (two) times daily as needed for cough. 118 mL Ayani Ospina K, PA-C      PDMP not reviewed this encounter.   Jeani Hawking, PA-C 06/17/23 1152

## 2023-10-20 ENCOUNTER — Encounter (HOSPITAL_COMMUNITY): Payer: Self-pay | Admitting: Emergency Medicine

## 2023-10-20 ENCOUNTER — Emergency Department (HOSPITAL_COMMUNITY)
Admission: EM | Admit: 2023-10-20 | Discharge: 2023-10-20 | Disposition: A | Discharge status: Home / Self Care | Attending: Emergency Medicine | Admitting: Emergency Medicine

## 2023-10-20 ENCOUNTER — Other Ambulatory Visit: Payer: Self-pay

## 2023-10-20 DIAGNOSIS — M545 Low back pain, unspecified: Secondary | ICD-10-CM | POA: Insufficient documentation

## 2023-10-20 DIAGNOSIS — M62838 Other muscle spasm: Secondary | ICD-10-CM | POA: Diagnosis not present

## 2023-10-20 LAB — URINALYSIS, ROUTINE W REFLEX MICROSCOPIC
Bilirubin Urine: NEGATIVE
Glucose, UA: NEGATIVE mg/dL
Hgb urine dipstick: NEGATIVE
Ketones, ur: NEGATIVE mg/dL
Leukocytes,Ua: NEGATIVE
Nitrite: NEGATIVE
Protein, ur: NEGATIVE mg/dL
Specific Gravity, Urine: 1.045 — ABNORMAL HIGH (ref 1.005–1.030)
pH: 5 (ref 5.0–8.0)

## 2023-10-20 LAB — PREGNANCY, URINE: Preg Test, Ur: NEGATIVE

## 2023-10-20 MED ORDER — CYCLOBENZAPRINE HCL 5 MG PO TABS: 5.0000 mg | ORAL_TABLET | Freq: Three times a day (TID) | ORAL | 0 refills | Status: DC | PRN

## 2023-10-20 MED ORDER — LIDOCAINE 5 % EX PTCH
1.0000 | MEDICATED_PATCH | CUTANEOUS | Status: DC
Start: 2023-10-20 — End: 2023-10-20
  Administered 2023-10-20: 1 via TRANSDERMAL
  Filled 2023-10-20: qty 1

## 2023-10-20 MED ORDER — CYCLOBENZAPRINE HCL 10 MG PO TABS
5.0000 mg | ORAL_TABLET | Freq: Once | ORAL | Status: AC
  Administered 2023-10-20: 5 mg via ORAL
  Filled 2023-10-20: qty 1

## 2023-10-20 MED ORDER — KETOROLAC TROMETHAMINE 15 MG/ML IJ SOLN
15.0000 mg | Freq: Once | INTRAMUSCULAR | Status: AC
  Administered 2023-10-20: 15 mg via INTRAMUSCULAR
  Filled 2023-10-20: qty 1

## 2023-10-20 MED ORDER — DIAZEPAM 5 MG PO TABS
5.0000 mg | ORAL_TABLET | Freq: Once | ORAL | Status: AC
Start: 2023-10-20 — End: 2023-10-20
  Administered 2023-10-20: 5 mg via ORAL
  Filled 2023-10-20: qty 1

## 2023-10-20 NOTE — ED Provider Notes (Signed)
 [place] EMERGENCY DEPARTMENT AT [facility] Provider Note   CSN: [id number] Arrival date & time: 10/20/23  0540     History  Chief Complaint  Patient presents with   Back Pain    Sandra Nicholson is a 38 y.o. female, no pertinent past medical history, who presents to the ED secondary to low back pain, has been bothering her for the last few days.  She states she is having hard time moving, because the pain is so severe, it is low back, and feels like it is very spastic.  She denies any loss of urine, bowel, groin paresthesias, numbness or tingling down either leg, or fevers, or chills.  No history of IV drug use.  Not on any immunosuppressant agents. Has tried ibuprofen and tylenol without relief.     Home Medications Prior to Admission medications   Medication Sig Start Date End Date Taking? Authorizing Provider  cyclobenzaprine (FLEXERIL) 5 MG tablet Take 1 tablet (5 mg total) by mouth 3 (three) times daily as needed. 10/20/23  Yes Wilmot Quevedo L, PA  acetaminophen (TYLENOL) 500 MG tablet Take 1 tablet (500 mg total) by mouth every 6 (six) hours as needed. 10/22/22   Garrison, [place] N, FNP  Aspirin-Acetaminophen-Caffeine (GOODY HEADACHE PO) Take 1 packet by mouth 2 (two) times daily as needed (pain).    [provider]  doxycycline (VIBRAMYCIN) 100 MG capsule Take 1 capsule (100 mg total) by mouth 2 (two) times daily. 06/17/23   Raspet, Noberto Retort, PA-C  promethazine-dextromethorphan (PROMETHAZINE-DM) 6.25-15 MG/5ML syrup Take 5 mLs by mouth 2 (two) times daily as needed for cough. 06/17/23   Raspet, Noberto Retort, PA-C      Allergies    Fish allergy and Macadamia nut oil    Review of Systems   Review of Systems  Genitourinary:  Negative for difficulty urinating.  Musculoskeletal:  Positive for back pain. Negative for neck pain.    Physical Exam Updated Vital Signs BP 98/65 (BP Location: Left Arm)   Pulse 69   Temp 98.4 F (36.9 C)   Resp 15   Ht 5\' 7"  (1.702 m)    Wt 68 kg   SpO2 100%   BMI 23.49 kg/m  Physical Exam Vitals and nursing note reviewed.  Constitutional:      General: She is not in acute distress.    Appearance: She is well-developed.  HENT:     Head: Normocephalic and atraumatic.  Eyes:     Conjunctiva/sclera: Conjunctivae normal.  Cardiovascular:     Rate and Rhythm: Normal rate and regular rhythm.     Heart sounds: No murmur heard. Pulmonary:     Effort: Pulmonary effort is normal. No respiratory distress.     Breath sounds: Normal breath sounds.  Abdominal:     Palpations: Abdomen is soft.     Tenderness: There is no abdominal tenderness.  Musculoskeletal:        General: No swelling.     Cervical back: Neck supple.     Comments: Spastic lumbar paraspinal muscles, without any evidence of rash, or wound.  Negative straight leg raise.  Patient more rigid, difficult to do exam given her pain.  Worse with truncal rotation.  5 out of 5 strength of bilateral lower extremities  Skin:    General: Skin is warm and dry.     Capillary Refill: Capillary refill takes less than 2 seconds.  Neurological:     Mental Status: She is alert.  Psychiatric:  Mood and Affect: Mood normal.    .sm  ED Results / Procedures / Treatments   Labs (all labs ordered are listed, but only abnormal results are displayed) Labs Reviewed  URINALYSIS, ROUTINE W REFLEX MICROSCOPIC - Abnormal; Notable for the following components:      Result Value   APPearance HAZY (*)    Specific Gravity, Urine 1.045 (*)    All other components within normal limits  PREGNANCY, URINE    EKG None  Radiology No results found.  Procedures Procedures    Medications Ordered in ED Medications  lidocaine (LIDODERM) 5 % 1 patch (1 patch Transdermal Patch Applied 10/20/23 0804)  diazepam (VALIUM) tablet 5 mg (5 mg Oral Given 10/20/23 0803)  ketorolac (TORADOL) 15 MG/ML injection 15 mg (15 mg Intramuscular Given 10/20/23 0804)  cyclobenzaprine (FLEXERIL) tablet 5  mg (5 mg Oral Given 10/20/23 0846)    ED Course/ Medical Decision Making/ A&P                                 Medical Decision Making Patient is a 38 year old female, here with low back pain, has been going on for the last few days.  She states it feels very spastic.  Has not had nausea, vomiting, or use of diuretics, thus I think unlikely to be electrolyte related.  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  We will check urine just to make sure there are no urinary tract infection or pregnancy, and give Toradol Valium, and lidocaine patch, and monitor for relief.  She has no red flag symptoms at this time  Amount and/or Complexity of Data Reviewed Labs: ordered.    Details: Unremarkable urine Discussion of management or test interpretation with external provider(s): Patient feeling much better, after medications, able to walk, freely.  She has no neurodeficits, no red flags, urine is unremarkable.  I believe that this is likely a muscle strain, which is causing the spasms.  She will follow-up with her primary care doctor, return if symptoms worsen.  Sent Flexeril home  Risk Prescription drug management.    Final Clinical Impression(s) / ED Diagnoses Final diagnoses:  Acute bilateral low back pain without sciatica  Muscle spasm    Rx / DC Orders ED Discharge Orders          Ordered    cyclobenzaprine (FLEXERIL) 5 MG tablet  3 times daily PRN        10/20/23 0950              Giani Betzold, Harley Alto, PA 10/20/23 1042    Eber Hong, MD 10/21/23 [phone]

## 2023-10-20 NOTE — ED Triage Notes (Signed)
 Patient coming to ED for evaluation of lower back pain. Reports symptoms started a few days ago.  No reports of injury.  Has been trying OTC medication and home remedies without relief.   C/o muscle spasms.  When sitting, having increased difficulty "getting back up."  No reports of numbness or tingling.

## 2023-10-20 NOTE — Discharge Instructions (Signed)
 I think you have a low back sprain, please take Tylenol and ibuprofen, and rest and use a heating pad for this.  Use muscle relaxers as needed.  They can make you sleepy so you may only want to take them at night.  Return to the ER if you have any loss of bowel, bladder, fevers, or chills.

## 2023-10-22 ENCOUNTER — Other Ambulatory Visit: Payer: Self-pay | Admitting: Nurse Practitioner

## 2024-08-04 ENCOUNTER — Encounter (HOSPITAL_COMMUNITY): Payer: Self-pay | Admitting: Emergency Medicine

## 2024-08-04 ENCOUNTER — Ambulatory Visit (HOSPITAL_COMMUNITY)
Admission: EM | Admit: 2024-08-04 | Discharge: 2024-08-04 | Disposition: A | Discharge status: Home / Self Care | Payer: Self-pay | Attending: Nurse Practitioner | Admitting: Nurse Practitioner

## 2024-08-04 DIAGNOSIS — R051 Acute cough: Secondary | ICD-10-CM

## 2024-08-04 DIAGNOSIS — J069 Acute upper respiratory infection, unspecified: Secondary | ICD-10-CM

## 2024-08-04 DIAGNOSIS — J029 Acute pharyngitis, unspecified: Secondary | ICD-10-CM

## 2024-08-04 LAB — POC SOFIA SARS ANTIGEN FIA: SARS Coronavirus 2 Ag: NEGATIVE

## 2024-08-04 LAB — POCT INFLUENZA A/B
Influenza A, POC: NEGATIVE
Influenza B, POC: NEGATIVE

## 2024-08-04 MED ORDER — FLUTICASONE PROPIONATE 50 MCG/ACT NA SUSP: 1.0000 | Freq: Every day | NASAL | 0 refills | Status: AC

## 2024-08-04 MED ORDER — LIDOCAINE VISCOUS HCL 2 % MT SOLN: 15.0000 mL | OROMUCOSAL | 0 refills | Status: AC | PRN

## 2024-08-04 NOTE — ED Provider Notes (Signed)
 " [facility]    CSN: [id number] Arrival date & time: 08/04/24  1036      History   Chief Complaint Chief Complaint  Patient presents with   Nasal Congestion   Cough    HPI Sandra Nicholson is a 39 y.o. female.   Patient presents today with 3 to 4-day history of fever, chills, shortness of breath, decreased appetite, and fatigue that have improved.  Reports he began having a sore throat and some hoarseness today as well as some ear pain worse in the right ear and became concerned.  No cough, shortness of breath, chest pain or tightness, runny or stuffy nose, headache, abdominal pain, nausea/vomiting, or diarrhea.  No known sick contacts.  Wonders if she may need steroids and is requesting pain medicine today for her throat.  Has tried cough syrup, Mucinex, and Claritin for symptoms with minimal improvement.    Past Medical History:  Diagnosis Date   Allergy     Patient Active Problem List   Diagnosis Date Noted   Sarcoidosis 06/13/2020   Iron  deficiency anemia 06/13/2020    Past Surgical History:  Procedure Laterality Date   LYMPH NODE BIOPSY  03/2020   Per patient   NO PAST SURGERIES     WISDOM TOOTH EXTRACTION  2021   Per patient    OB History   No obstetric history on file.      Home Medications    Prior to Admission medications  Medication Sig Start Date End Date Taking? Authorizing Provider  fluticasone  (FLONASE ) 50 MCG/ACT nasal spray Place 1 spray into both nostrils daily. 08/04/24  Yes Chandra Harlene LABOR, NP  lidocaine  (XYLOCAINE ) 2 % solution Use as directed 15 mLs in the mouth or throat every 3 (three) hours as needed for mouth pain. Gargle and spit as needed for throat pain 08/04/24  Yes Chandra Harlene LABOR, NP    Family History Family History  Problem Relation Age of Onset   Diabetes Father    Hypertension Father    Kidney disease Father     Social History Social History[1]   Allergies   Fish allergy and Macadamia nut  oil   Review of Systems Review of Systems Per HPI  Physical Exam Triage Vital Signs ED Triage Vitals  Encounter Vitals Group     BP 08/04/24 1055 103/72     Girls Systolic BP Percentile --      Girls Diastolic BP Percentile --      Boys Systolic BP Percentile --      Boys Diastolic BP Percentile --      Pulse Rate 08/04/24 1055 92     Resp 08/04/24 1055 18     Temp 08/04/24 1055 98.5 F (36.9 C)     Temp Source 08/04/24 1055 Oral     SpO2 08/04/24 1055 98 %     Weight --      Height --      Head Circumference --      Peak Flow --      Pain Score 08/04/24 1053 8     Pain Loc --      Pain Education --      Exclude from Growth Chart --    No data found.  Updated Vital Signs BP 103/72 (BP Location: Right Arm)   Pulse 92   Temp 98.5 F (36.9 C) (Oral)   Resp 18   LMP 07/30/2024 (Approximate)   SpO2 98%   Visual Acuity Right  Eye Distance:   Left Eye Distance:   Bilateral Distance:    Right Eye Near:   Left Eye Near:    Bilateral Near:     Physical Exam Vitals and nursing note reviewed.  Constitutional:      General: She is not in acute distress.    Appearance: Normal appearance. She is not ill-appearing or toxic-appearing.  HENT:     Head: Normocephalic and atraumatic.     Right Ear: Tympanic membrane, ear canal and external ear normal.     Left Ear: Tympanic membrane, ear canal and external ear normal.     Nose: No congestion or rhinorrhea.     Mouth/Throat:     Mouth: Mucous membranes are moist.     Pharynx: Oropharynx is clear. Postnasal drip present. No oropharyngeal exudate or posterior oropharyngeal erythema.  Eyes:     General: No scleral icterus.    Extraocular Movements: Extraocular movements intact.  Cardiovascular:     Rate and Rhythm: Normal rate and regular rhythm.  Pulmonary:     Effort: Pulmonary effort is normal. No respiratory distress.     Breath sounds: Normal breath sounds. No wheezing, rhonchi or rales.  Musculoskeletal:      Cervical back: Normal range of motion and neck supple.  Lymphadenopathy:     Cervical: No cervical adenopathy.  Skin:    General: Skin is warm and dry.     Coloration: Skin is not jaundiced or pale.     Findings: No erythema or rash.  Neurological:     Mental Status: She is alert and oriented to person, place, and time.  Psychiatric:        Behavior: Behavior is cooperative.      UC Treatments / Results  Labs (all labs ordered are listed, but only abnormal results are displayed) Labs Reviewed  POC SOFIA SARS ANTIGEN FIA  POCT INFLUENZA A/B    EKG   Radiology No results found.  Procedures Procedures (including critical care time)  Medications Ordered in UC Medications - No data to display  Initial Impression / Assessment and Plan / UC Course  I have reviewed the triage vital signs and the nursing notes.  Pertinent labs & imaging results that were available during my care of the patient were reviewed by me and considered in my medical decision making (see chart for details).   Patient is a well-appearing 39 year old female presenting today for sore throat and hoarseness.  Vital signs are stable and exam is reassuring.  COVID-19 and influenza testing is negative today.  Suspect viral pharyngitis and explained that postnasal drainage is likely cause of sore throat.  Start lidocaine  rinses, Tylenol /ibuprofen  as needed for pain.  Also recommended Flonase  nasal spray.  ER and return precautions discussed.  The patient was given the opportunity to ask questions.  All questions answered to their satisfaction.  The patient is in agreement to this plan.   Final Clinical Impressions(s) / UC Diagnoses   Final diagnoses:  Acute cough  Viral URI  Viral pharyngitis     Discharge Instructions      COVID-19 and influenza testing is negative today.  As we discussed, the sore throat is likely caused by a virus and postnasal drainage.  Start using the Flonase  and you can use a  lidocaine  rinses as needed for throat pain.  You can also try warm salt water gargles, Chloraseptic spray, and Cepacol lozenges.  Seek care if symptoms persist or worsen despite treatment.  ED Prescriptions     Medication Sig Dispense Auth. Provider   lidocaine  (XYLOCAINE ) 2 % solution Use as directed 15 mLs in the mouth or throat every 3 (three) hours as needed for mouth pain. Gargle and spit as needed for throat pain 100 mL Chandra Raisin A, NP   fluticasone  (FLONASE ) 50 MCG/ACT nasal spray Place 1 spray into both nostrils daily. 16 g Chandra Raisin LABOR, NP      PDMP not reviewed this encounter.     [1]  Social History Tobacco Use   Smoking status: Never   Smokeless tobacco: Never  Vaping Use   Vaping status: Former  Substance Use Topics   Alcohol use: Not Currently    Comment: Socially   Drug use: No     Chandra Raisin LABOR, NP 08/04/24 1328  "

## 2024-08-04 NOTE — Discharge Instructions (Signed)
 COVID-19 and influenza testing is negative today.  As we discussed, the sore throat is likely caused by a virus and postnasal drainage.  Start using the Flonase  and you can use a lidocaine  rinses as needed for throat pain.  You can also try warm salt water gargles, Chloraseptic spray, and Cepacol lozenges.  Seek care if symptoms persist or worsen despite treatment.

## 2024-08-04 NOTE — ED Triage Notes (Signed)
 Pt reports started having chest cold and cough for couple days. Then started having chills, hoarse voice and sore throat. Took some Claritin and Mucinex and something for pain.
